# Patient Record
Sex: Male | Born: 1990 | Race: Black or African American | Hispanic: No | Marital: Married | Smoking: Never smoker
Health system: Southern US, Community
[De-identification: ages and names within clinical notes are randomized; demographics above are authoritative.]

## PROBLEM LIST (undated history)

## (undated) HISTORY — PX: WISDOM TOOTH EXTRACTION: SHX21

## (undated) HISTORY — PX: SHOULDER SURGERY: SHX246

---
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
## 2013-02-12 ENCOUNTER — Encounter (HOSPITAL_BASED_OUTPATIENT_CLINIC_OR_DEPARTMENT_OTHER): Payer: Self-pay | Admitting: Emergency Medicine

## 2013-02-12 ENCOUNTER — Emergency Department (HOSPITAL_BASED_OUTPATIENT_CLINIC_OR_DEPARTMENT_OTHER)
Admission: EM | Admit: 2013-02-12 | Discharge: 2013-02-13 | Disposition: A | Discharge status: Home / Self Care | Payer: Self-pay | Attending: Emergency Medicine | Admitting: Emergency Medicine

## 2013-02-12 DIAGNOSIS — M549 Dorsalgia, unspecified: Secondary | ICD-10-CM | POA: Insufficient documentation

## 2013-02-12 DIAGNOSIS — F17200 Nicotine dependence, unspecified, uncomplicated: Secondary | ICD-10-CM | POA: Insufficient documentation

## 2013-02-12 LAB — URINALYSIS, ROUTINE W REFLEX MICROSCOPIC
BILIRUBIN URINE: NEGATIVE
Glucose, UA: NEGATIVE mg/dL
Hgb urine dipstick: NEGATIVE
KETONES UR: NEGATIVE mg/dL
LEUKOCYTES UA: NEGATIVE
NITRITE: NEGATIVE
PROTEIN: NEGATIVE mg/dL
SPECIFIC GRAVITY, URINE: 1.023 (ref 1.005–1.030)
UROBILINOGEN UA: 1 mg/dL (ref 0.0–1.0)
pH: 7.5 (ref 5.0–8.0)

## 2013-02-12 MED ORDER — HYDROCOD POLST-CHLORPHEN POLST 10-8 MG/5ML PO LQCR
5.0000 mL | Freq: Once | ORAL | Status: AC
  Administered 2013-02-12: 5 mL via ORAL
  Filled 2013-02-12: qty 5

## 2013-02-12 MED ORDER — HYDROCOD POLST-CHLORPHEN POLST 10-8 MG/5ML PO LQCR: 5.0000 mL | Freq: Two times a day (BID) | ORAL | Status: DC | PRN

## 2013-02-12 MED ORDER — CYCLOBENZAPRINE HCL 10 MG PO TABS: 10.0000 mg | ORAL_TABLET | Freq: Three times a day (TID) | ORAL | Status: DC | PRN

## 2013-02-12 NOTE — Discharge Instructions (Signed)
Back Pain, Adult Low back pain is very common. About 1 in 5 people have back pain.The cause of low back pain is rarely dangerous. The pain often gets better over time.About half of people with a sudden onset of back pain feel better in just 2 weeks. About 8 in 10 people feel better by 6 weeks.  CAUSES Some common causes of back pain include:  Strain of the muscles or ligaments supporting the spine.  Wear and tear (degeneration) of the spinal discs.  Arthritis.  Direct injury to the back. DIAGNOSIS Most of the time, the direct cause of low back pain is not known.However, back pain can be treated effectively even when the exact cause of the pain is unknown.Answering your caregiver's questions about your overall health and symptoms is one of the most accurate ways to make sure the cause of your pain is not dangerous. If your caregiver needs more information, he or she may order lab work or imaging tests (X-rays or MRIs).However, even if imaging tests show changes in your back, this usually does not require surgery. HOME CARE INSTRUCTIONS For many people, back pain returns.Since low back pain is rarely dangerous, it is often a condition that people can learn to manageon their own.   Remain active. It is stressful on the back to sit or stand in one place. Do not sit, drive, or stand in one place for more than 30 minutes at a time. Take short walks on level surfaces as soon as pain allows.Try to increase the length of time you walk each day.  Do not stay in bed.Resting more than 1 or 2 days can delay your recovery.  Do not avoid exercise or work.Your body is made to move.It is not dangerous to be active, even though your back may hurt.Your back will likely heal faster if you return to being active before your pain is gone.  Pay attention to your body when you bend and lift. Many people have less discomfortwhen lifting if they bend their knees, keep the load close to their bodies,and  avoid twisting. Often, the most comfortable positions are those that put less stress on your recovering back.  Find a comfortable position to sleep. Use a firm mattress and lie on your side with your knees slightly bent. If you lie on your back, put a pillow under your knees.  Only take over-the-counter or prescription medicines as directed by your caregiver. Over-the-counter medicines to reduce pain and inflammation are often the most helpful.Your caregiver may prescribe muscle relaxant drugs.These medicines help dull your pain so you can more quickly return to your normal activities and healthy exercise.  Put ice on the injured area.  Put ice in a plastic bag.  Place a towel between your skin and the bag.  Leave the ice on for 15-20 minutes, 03-04 times a day for the first 2 to 3 days. After that, ice and heat may be alternated to reduce pain and spasms.  Ask your caregiver about trying back exercises and gentle massage. This may be of some benefit.  Avoid feeling anxious or stressed.Stress increases muscle tension and can worsen back pain.It is important to recognize when you are anxious or stressed and learn ways to manage it.Exercise is a great option. SEEK MEDICAL CARE IF:  You have pain that is not relieved with rest or medicine.  You have pain that does not improve in 1 week.  You have new symptoms.  You are generally not feeling well. SEEK   IMMEDIATE MEDICAL CARE IF:   You have pain that radiates from your back into your legs.  You develop new bowel or bladder control problems.  You have unusual weakness or numbness in your arms or legs.  You develop nausea or vomiting.  You develop abdominal pain.  You feel faint. Document Released: 01/17/2005 Document Revised: 07/19/2011 Document Reviewed: 06/07/2010 [employer] Patient Information 2014 [employer].  

## 2013-02-12 NOTE — ED Provider Notes (Signed)
CSN: [id number]     Arrival date & time 02/12/13  2242 History   None    This chart was scribed for Hanley Seamen, MD by Arlan Organ, ED Scribe. This patient was seen in room MH07/MH07 and the patient's care was started 11:43 PM.  Chief Complaint  Patient presents with  . Flank Pain   HPI  HPI Comments: Andrew Hampton is a 23 y.o. male who presents to the Emergency Department complaining of gradual onset, gradually worsening, moderate bilateral flank pain that initially started a few weeks ago, but has worsened in the last 24 hours. He also reports fatigue, a persistent productive cough, and a fever that has now resolved. Pain in flanks in worse with ambulation. He denies chills. Denies any known allergies. Denies heavy lifting or other abnormal activity.  History reviewed. No pertinent past medical history. History reviewed. No pertinent past surgical history. History reviewed. No pertinent family history. History  Substance Use Topics  . Smoking status: Current Every Day Smoker    Types: Cigarettes  . Smokeless tobacco: Not on file  . Alcohol Use: No    Review of Systems  All other systems reviewed and are negative.   Allergies  Review of patient's allergies indicates no known allergies.  Home Medications  No current outpatient prescriptions on file.  Triage Vitals: BP 164/96  Pulse 80  Temp(Src) 98.2 F (36.8 C) (Oral)  Resp 16  Physical Exam  General: Well-developed, well-nourished male in no acute distress; appearance consistent with age of record HENT: normocephalic; atraumatic Eyes: pupils equal, round and reactive to light; extraocular muscles intact Neck: supple Heart: regular rate and rhythm; no murmurs, rubs or gallops Lungs: clear to auscultation bilaterally Abdomen: soft; nondistended; nontender; no masses or hepatosplenomegaly; bowel sounds present Extremities: No deformity; full range of motion; pulses normal Back: Mild tenderness of flank muscles  bilaterally, worse on left than right; pain in flanks with flexion/extension of back Neurologic: Awake, alert and oriented; motor function intact in all extremities and symmetric; no facial droop Skin: Warm and dry Psychiatric: Normal mood and affect   ED Course  Procedures (including critical care time)    COORDINATION OF CARE: 11:47 PM- Will give pain medication and muscle relaxer. Discussed treatment plan with pt at bedside and pt agreed to plan.     MDM   Nursing notes and vitals signs, including pulse oximetry, reviewed.  Summary of this visit's results, reviewed by myself:  Labs:  Results for orders placed during the hospital encounter of 02/12/13 (from the past 24 hour(s))  URINALYSIS, ROUTINE W REFLEX MICROSCOPIC     Status: None   Collection Time    02/12/13 10:48 PM      Result Value Range   Color, Urine YELLOW  YELLOW   APPearance CLEAR  CLEAR   Specific Gravity, Urine 1.023  1.005 - 1.030   pH 7.5  5.0 - 8.0   Glucose, UA NEGATIVE  NEGATIVE mg/dL   Hgb urine dipstick NEGATIVE  NEGATIVE   Bilirubin Urine NEGATIVE  NEGATIVE   Ketones, ur NEGATIVE  NEGATIVE mg/dL   Protein, ur NEGATIVE  NEGATIVE mg/dL   Urobilinogen, UA 1.0  0.0 - 1.0 mg/dL   Nitrite NEGATIVE  NEGATIVE   Leukocytes, UA NEGATIVE  NEGATIVE     Dx bilateral flank muscle strain due to coughing (EPIC did not have adequate Dx)  I personally performed the services described in this documentation, which was scribed in my presence.  The recorded  information has been reviewed and is accurate.  Hanley SeamenJohn L Rishabh Rinkenberger, MD 02/13/13 0010

## 2013-02-12 NOTE — ED Notes (Signed)
Pt c/o bil flank pain x 3 days

## 2013-03-19 ENCOUNTER — Emergency Department (HOSPITAL_COMMUNITY)
Admission: EM | Admit: 2013-03-19 | Discharge: 2013-03-20 | Disposition: A | Discharge status: Home / Self Care | Payer: Managed Care, Other (non HMO) | Attending: Emergency Medicine | Admitting: Emergency Medicine

## 2013-03-19 ENCOUNTER — Encounter (HOSPITAL_COMMUNITY): Payer: Self-pay | Admitting: Emergency Medicine

## 2013-03-19 ENCOUNTER — Emergency Department (HOSPITAL_COMMUNITY)
Admission: EM | Admit: 2013-03-19 | Discharge: 2013-03-19 | Disposition: A | Discharge status: Home / Self Care | Payer: Managed Care, Other (non HMO) | Attending: Emergency Medicine | Admitting: Emergency Medicine

## 2013-03-19 DIAGNOSIS — R5381 Other malaise: Secondary | ICD-10-CM | POA: Insufficient documentation

## 2013-03-19 DIAGNOSIS — R109 Unspecified abdominal pain: Secondary | ICD-10-CM

## 2013-03-19 DIAGNOSIS — R112 Nausea with vomiting, unspecified: Secondary | ICD-10-CM

## 2013-03-19 DIAGNOSIS — F17200 Nicotine dependence, unspecified, uncomplicated: Secondary | ICD-10-CM | POA: Insufficient documentation

## 2013-03-19 DIAGNOSIS — R630 Anorexia: Secondary | ICD-10-CM | POA: Insufficient documentation

## 2013-03-19 DIAGNOSIS — R197 Diarrhea, unspecified: Secondary | ICD-10-CM

## 2013-03-19 DIAGNOSIS — R1031 Right lower quadrant pain: Secondary | ICD-10-CM | POA: Insufficient documentation

## 2013-03-19 DIAGNOSIS — R509 Fever, unspecified: Secondary | ICD-10-CM | POA: Insufficient documentation

## 2013-03-19 DIAGNOSIS — R5383 Other fatigue: Secondary | ICD-10-CM

## 2013-03-19 LAB — CBC WITH DIFFERENTIAL/PLATELET
BASOS ABS: 0 10*3/uL (ref 0.0–0.1)
BASOS ABS: 0 10*3/uL (ref 0.0–0.1)
BASOS PCT: 0 % (ref 0–1)
Basophils Relative: 0 % (ref 0–1)
Eosinophils Absolute: 0 10*3/uL (ref 0.0–0.7)
Eosinophils Absolute: 0 10*3/uL (ref 0.0–0.7)
Eosinophils Relative: 0 % (ref 0–5)
Eosinophils Relative: 0 % (ref 0–5)
HCT: 49.9 % (ref 39.0–52.0)
HCT: 41.6 % (ref 39.0–52.0)
HEMOGLOBIN: 18.4 g/dL — AB (ref 13.0–17.0)
HEMOGLOBIN: 15.3 g/dL (ref 13.0–17.0)
LYMPHS PCT: 12 % (ref 12–46)
Lymphocytes Relative: 4 % — ABNORMAL LOW (ref 12–46)
Lymphs Abs: 0.6 10*3/uL — ABNORMAL LOW (ref 0.7–4.0)
Lymphs Abs: 1 10*3/uL (ref 0.7–4.0)
MCH: 33 pg (ref 26.0–34.0)
MCH: 32.9 pg (ref 26.0–34.0)
MCHC: 36.9 g/dL — AB (ref 30.0–36.0)
MCHC: 36.8 g/dL — ABNORMAL HIGH (ref 30.0–36.0)
MCV: 89.4 fL (ref 78.0–100.0)
MCV: 89.5 fL (ref 78.0–100.0)
MONO ABS: 0.4 10*3/uL (ref 0.1–1.0)
MONOS PCT: 5 % (ref 3–12)
Monocytes Absolute: 0.7 10*3/uL (ref 0.1–1.0)
Monocytes Relative: 5 % (ref 3–12)
NEUTROS ABS: 14.4 10*3/uL — AB (ref 1.7–7.7)
NEUTROS ABS: 6.7 10*3/uL (ref 1.7–7.7)
NEUTROS PCT: 92 % — AB (ref 43–77)
Neutrophils Relative %: 83 % — ABNORMAL HIGH (ref 43–77)
PLATELETS: 156 10*3/uL (ref 150–400)
Platelets: 157 10*3/uL (ref 150–400)
RBC: 5.58 MIL/uL (ref 4.22–5.81)
RBC: 4.65 MIL/uL (ref 4.22–5.81)
RDW: 11.6 % (ref 11.5–15.5)
RDW: 11.7 % (ref 11.5–15.5)
WBC: 15.7 10*3/uL — ABNORMAL HIGH (ref 4.0–10.5)
WBC: 8.1 10*3/uL (ref 4.0–10.5)

## 2013-03-19 LAB — COMPREHENSIVE METABOLIC PANEL
ALT: 20 U/L (ref 0–53)
AST: 22 U/L (ref 0–37)
Albumin: 3.6 g/dL (ref 3.5–5.2)
Alkaline Phosphatase: 56 U/L (ref 39–117)
BILIRUBIN TOTAL: 0.5 mg/dL (ref 0.3–1.2)
BUN: 12 mg/dL (ref 6–23)
CHLORIDE: 100 meq/L (ref 96–112)
CO2: 25 meq/L (ref 19–32)
CREATININE: 1.11 mg/dL (ref 0.50–1.35)
Calcium: 8.7 mg/dL (ref 8.4–10.5)
GFR calc Af Amer: >90 mL/min (ref >90)
Glucose, Bld: 109 mg/dL — ABNORMAL HIGH (ref 70–99)
POTASSIUM: 3.7 meq/L (ref 3.7–5.3)
Sodium: 139 mEq/L (ref 137–147)
Total Protein: 7.3 g/dL (ref 6.0–8.3)

## 2013-03-19 LAB — POCT I-STAT, CHEM 8
BUN: 23 mg/dL (ref 6–23)
CHLORIDE: 102 meq/L (ref 96–112)
Calcium, Ion: 1.13 mmol/L (ref 1.12–1.23)
Creatinine, Ser: 1.2 mg/dL (ref 0.50–1.35)
Glucose, Bld: 133 mg/dL — ABNORMAL HIGH (ref 70–99)
HEMATOCRIT: 57 % — AB (ref 39.0–52.0)
Hemoglobin: 19.4 g/dL — ABNORMAL HIGH (ref 13.0–17.0)
POTASSIUM: 5.3 meq/L (ref 3.7–5.3)
SODIUM: 140 meq/L (ref 137–147)
TCO2: 27 mmol/L (ref 0–100)

## 2013-03-19 LAB — LIPASE, BLOOD: Lipase: 15 U/L (ref 11–59)

## 2013-03-19 MED ORDER — MORPHINE SULFATE 4 MG/ML IJ SOLN
4.0000 mg | Freq: Once | INTRAMUSCULAR | Status: AC
  Administered 2013-03-19: 4 mg via INTRAVENOUS
  Filled 2013-03-19: qty 1

## 2013-03-19 MED ORDER — ONDANSETRON HCL 4 MG/2ML IJ SOLN
4.0000 mg | Freq: Once | INTRAMUSCULAR | Status: AC
  Administered 2013-03-19: 4 mg via INTRAVENOUS
  Filled 2013-03-19: qty 2

## 2013-03-19 MED ORDER — SODIUM CHLORIDE 0.9 % IV BOLUS (SEPSIS)
1000.0000 mL | Freq: Once | INTRAVENOUS | Status: AC
  Administered 2013-03-19: 1000 mL via INTRAVENOUS

## 2013-03-19 MED ORDER — IOHEXOL 300 MG/ML  SOLN
25.0000 mL | INTRAMUSCULAR | Status: AC
  Administered 2013-03-19: 25 mL via ORAL

## 2013-03-19 MED ORDER — PROMETHAZINE HCL 12.5 MG PO TABS: 12.5000 mg | ORAL_TABLET | Freq: Four times a day (QID) | ORAL | Status: DC | PRN

## 2013-03-19 NOTE — ED Notes (Signed)
Pt states that he was seen yesterday for the emesis, abdominal pain and diarrhea. Pt states that he was given medications here and his fever went down here. Pt got his prescriptions filled for nausea and took the medication but was unable to eat or drink. Pt states unable to keep water or Gatorade down.

## 2013-03-19 NOTE — ED Provider Notes (Signed)
CSN: [id number]     Arrival date & time 03/19/13  2023 History   First MD Initiated Contact with Patient 03/19/13 2106     Chief Complaint  Patient presents with  . Emesis  . Abdominal Pain     (Consider location/radiation/quality/duration/timing/severity/associated sxs/prior Treatment) HPI Comments: Patient is a 23 year old male who presents today with nausea, vomiting, abdominal pain. The pain began last night and has been gradually worsening. He was evaluated in the emergency Department at 9:00 this morning. He was found to have an elevated white blood cell count, but for workup was otherwise unremarkable. He was told by Roxy Horsemanobert Browning, PA-C To return if he developed a fever or could not tolerate any liquids. He has been unable to take his nausea medicine and due to vomiting. He was cooking hot pockets earlier in the smell worsened his nausea. His pain has localized to his right lower quadrant. He denies any abdominal surgeries. His son has recently had similar symptoms.   Patient is a 23 y.o. male presenting with vomiting and abdominal pain. The history is provided by the patient. No language interpreter was used.  Emesis Associated symptoms: abdominal pain   Abdominal Pain Associated symptoms: fatigue, fever, nausea and vomiting   Associated symptoms: no chest pain and no shortness of breath     History reviewed. No pertinent past medical history. Past Surgical History  Procedure Laterality Date  . Wisdom tooth extraction    . Shoulder surgery      librium   History reviewed. No pertinent family history. History  Substance Use Topics  . Smoking status: Current Every Day Smoker    Types: Cigarettes  . Smokeless tobacco: Not on file  . Alcohol Use: No    Review of Systems  Constitutional: Positive for fever, appetite change and fatigue.  Respiratory: Negative for shortness of breath.   Cardiovascular: Negative for chest pain.  Gastrointestinal: Positive for nausea,  vomiting and abdominal pain. Negative for blood in stool and anal bleeding.  All other systems reviewed and are negative.      Allergies  Bee venom and Shellfish allergy  Home Medications   Current Outpatient Rx  Name  Route  Sig  Dispense  Refill  . promethazine (PHENERGAN) 12.5 MG tablet   Oral   Take 1 tablet (12.5 mg total) by mouth every 6 (six) hours as needed for nausea or vomiting.   15 tablet   0    BP 146/85  Pulse 103  Temp(Src) 100.4 F (38 C) (Oral)  Resp 16  Ht 6' (1.829 m)  Wt 156 lb (70.761 kg)  BMI 21.15 kg/m2  SpO2 100% Physical Exam  Nursing note and vitals reviewed. Constitutional: He is oriented to person, place, and time. He appears well-developed and well-nourished. No distress.  HENT:  Head: Normocephalic and atraumatic.  Right Ear: External ear normal.  Left Ear: External ear normal.  Nose: Nose normal.  Eyes: Conjunctivae are normal.  Neck: Normal range of motion. No tracheal deviation present.  Cardiovascular: Normal rate, regular rhythm and normal heart sounds.   Pulmonary/Chest: Effort normal and breath sounds normal. No stridor.  Abdominal: Soft. Bowel sounds are normal. He exhibits no distension. There is tenderness in the right lower quadrant. There is no rigidity and no guarding.  Musculoskeletal: Normal range of motion.  Neurological: He is alert and oriented to person, place, and time.  Skin: Skin is warm and dry. He is not diaphoretic.  Psychiatric: He has a normal mood and  affect. His behavior is normal.    ED Course  Procedures (including critical care time) Labs Review Labs Reviewed  CBC WITH DIFFERENTIAL - Abnormal; Notable for the following:    MCHC 36.8 (*)    Neutrophils Relative % 83 (*)    All other components within normal limits  COMPREHENSIVE METABOLIC PANEL - Abnormal; Notable for the following:    Glucose, Bld 109 (*)    All other components within normal limits  LIPASE, BLOOD   Imaging Review Ct Abdomen  Pelvis W Contrast  03/20/2013   CLINICAL DATA:  Nausea and vomiting.  EXAM: CT ABDOMEN AND PELVIS WITH CONTRAST  TECHNIQUE: Multidetector CT imaging of the abdomen and pelvis was performed using the standard protocol following bolus administration of intravenous contrast.  CONTRAST:  OMNIPAQUE IOHEXOL 300 MG/ML  SOLN  COMPARISON:  None available for comparison at time of study interpretation.  FINDINGS: Included view of the lung bases are clear. Visualized heart and pericardium are unremarkable.  The liver, spleen, gallbladder, pancreas and adrenal glands are unremarkable.  The stomach, small and large bowel are normal in course and caliber without inflammatory changes. Contrast has not yet reached the large bowel. 3 mm appendicolith, without superimposed appendiceal inflammatory changes or dilatation. No intraperitoneal free fluid nor free air.  Kidneys are orthotopic, demonstrating symmetric enhancement without nephrolithiasis, hydronephrosis or renal masses. The unopacified ureters are normal in course and caliber. Delayed imaging through the kidneys demonstrates symmetric prompt excretion to the proximal urinary collecting system. Urinary bladder is partially distended and unremarkable.  Great vessels are normal in course and caliber. No lymphadenopathy by CT size criteria. Internal reproductive organs are unremarkable. The soft tissues and included osseous structures are nonsuspicious. Scattered Schmorl's nodes.  IMPRESSION: No acute intra-abdominal or pelvic process. Appendicolith without appendicitis.   Electronically Signed   By: Awilda Metro   On: 03/20/2013 01:05    EKG Interpretation   None       MDM   Final diagnoses:  Abdominal pain  Nausea vomiting and diarrhea   Patient is nontoxic, nonseptic appearing, in no apparent distress.  Patient's pain and other symptoms adequately managed in emergency department.  Fluid bolus given.  Labs, imaging and vitals reviewed.  Patient does  not meet the SIRS or Sepsis criteria.  On repeat exam patient does not have a surgical abdomen and there are no peritoneal signs.  No indication of appendicitis, bowel obstruction, bowel perforation, cholecystitis, diverticulitis. Patient discharged home with symptomatic treatment and given strict instructions for follow-up with their primary care physician.  I have also discussed reasons to return immediately to the ER.  Patient expresses understanding and agrees with plan.       Mora Bellman, PA-C 03/20/13 1146

## 2013-03-19 NOTE — ED Provider Notes (Signed)
CSN: [id number]     Arrival date & time 03/19/13  7829 History   First MD Initiated Contact with Patient 03/19/13 [id number]     Chief Complaint  Patient presents with  . Nausea  . Emesis  . Diarrhea     (Consider location/radiation/quality/duration/timing/severity/associated sxs/prior Treatment) HPI Comments: Patient presents to the ED with a chief complaint of n/v/d and abdominal cramps.  Patient states that the symptoms began last night.  He states that his son was recently sick with similar symptoms.  He states that his level of discomfort is an 8/10.  He has not tried taking anything to alleviate his symptoms.  He denies fevers or chills.   Denies melena or hematemesis.  No past abdominal surgeries.  The history is provided by the patient. No language interpreter was used.    History reviewed. No pertinent past medical history. History reviewed. No pertinent past surgical history. No family history on file. History  Substance Use Topics  . Smoking status: Current Every Day Smoker    Types: Cigarettes  . Smokeless tobacco: Not on file  . Alcohol Use: No    Review of Systems  All other systems reviewed and are negative.      Allergies  Review of patient's allergies indicates no known allergies.  Home Medications   Current Outpatient Rx  Name  Route  Sig  Dispense  Refill  . chlorpheniramine-HYDROcodone (TUSSIONEX PENNKINETIC ER) 10-8 MG/5ML LQCR   Oral   Take 5 mLs by mouth every 12 (twelve) hours as needed for cough.   70 mL   0   . cyclobenzaprine (FLEXERIL) 10 MG tablet   Oral   Take 1 tablet (10 mg total) by mouth 3 (three) times daily as needed for muscle spasms.   20 tablet   0    BP 157/84  Pulse 77  Temp(Src) 97.9 F (36.6 C) (Oral)  Resp 12  Ht 6' (1.829 m)  Wt 145 lb (65.772 kg)  BMI 19.66 kg/m2  SpO2 100% Physical Exam  Nursing note and vitals reviewed. Constitutional: He is oriented to person, place, and time. He appears well-developed and  well-nourished.  HENT:  Head: Normocephalic and atraumatic.  Eyes: Conjunctivae and EOM are normal. Pupils are equal, round, and reactive to light. Right eye exhibits no discharge. Left eye exhibits no discharge. No scleral icterus.  Neck: Normal range of motion. Neck supple. No JVD present.  Cardiovascular: Normal rate, regular rhythm and normal heart sounds.  Exam reveals no gallop and no friction rub.   No murmur heard. Pulmonary/Chest: Effort normal and breath sounds normal. No respiratory distress. He has no wheezes. He has no rales. He exhibits no tenderness.  Abdominal: Soft. He exhibits no distension and no mass. There is no tenderness. There is no rebound and no guarding.  No focal abdominal tenderness, no RLQ tenderness or pain at McBurney's point, no RUQ tenderness or Murphy's sign, no left-sided abdominal tenderness, no fluid wave, or signs of peritonitis   Musculoskeletal: Normal range of motion. He exhibits no edema and no tenderness.  Neurological: He is alert and oriented to person, place, and time.  Skin: Skin is warm and dry.  Psychiatric: He has a normal mood and affect. His behavior is normal. Judgment and thought content normal.    ED Course  Procedures (including critical care time) Labs Review Results for orders placed during the hospital encounter of 03/19/13  CBC WITH DIFFERENTIAL      Result Value Ref Range  WBC 15.7 (*) 4.0 - 10.5 K/uL   RBC 5.58  4.22 - 5.81 MIL/uL   Hemoglobin 18.4 (*) 13.0 - 17.0 g/dL   HCT 04.549.9  40.939.0 - 81.152.0 %   MCV 89.4  78.0 - 100.0 fL   MCH 33.0  26.0 - 34.0 pg   MCHC 36.9 (*) 30.0 - 36.0 g/dL   RDW 91.411.6  78.211.5 - 95.615.5 %   Platelets 156  150 - 400 K/uL   Neutrophils Relative % 92 (*) 43 - 77 %   Neutro Abs 14.4 (*) 1.7 - 7.7 K/uL   Lymphocytes Relative 4 (*) 12 - 46 %   Lymphs Abs 0.6 (*) 0.7 - 4.0 K/uL   Monocytes Relative 5  3 - 12 %   Monocytes Absolute 0.7  0.1 - 1.0 K/uL   Eosinophils Relative 0  0 - 5 %   Eosinophils  Absolute 0.0  0.0 - 0.7 K/uL   Basophils Relative 0  0 - 1 %   Basophils Absolute 0.0  0.0 - 0.1 K/uL  POCT I-STAT, CHEM 8      Result Value Ref Range   Sodium 140  137 - 147 mEq/L   Potassium 5.3  3.7 - 5.3 mEq/L   Chloride 102  96 - 112 mEq/L   BUN 23  6 - 23 mg/dL   Creatinine, Ser 2.131.20  0.50 - 1.35 mg/dL   Glucose, Bld 086133 (*) 70 - 99 mg/dL   Calcium, Ion 5.781.13  4.691.12 - 1.23 mmol/L   TCO2 27  0 - 100 mmol/L   Hemoglobin 19.4 (*) 13.0 - 17.0 g/dL   HCT 62.957.0 (*) 52.839.0 - 41.352.0 %   No results found.    EKG Interpretation   None       MDM   Final diagnoses:  Nausea vomiting and diarrhea    Pt with n/v/d and abdominal cramping.  No focal tenderness.  Will check labs, give fluids, treat pain and nausea.  Suspect viral syndrome.  Child was sick with the same 3 days ago.  No indication for imaging at this time.  Will reassess.  Leukocytosis, and some dehydration.  I suspect that both are related to his symptoms of vomiting and diarrhea, and are not specific for other acute abdominal process.   10:37 AM Patient is feeling better.  Tolerating PO.  Repeat abdominal exam is benign, no focal tenderness or signs of surgical abdomen.  Will give on more fluid bolus then discharge to home.  Will discharge with zofran.  I discussed specific return precautions with the patient.  He understands and agrees with the plan.      Roxy Horsemanobert Nicolaas Savo, PA-C 03/19/13 [phone]

## 2013-03-19 NOTE — ED Notes (Signed)
Pt provided sprite. Apple juice for patient's son.

## 2013-03-19 NOTE — ED Notes (Signed)
Pt states that he took the Zofran prescribed to him, but didn't help his nausea.

## 2013-03-19 NOTE — ED Notes (Signed)
Pt reports n/v/d since last night.

## 2013-03-19 NOTE — ED Notes (Signed)
Family at bedside with pt's son. Cartoons turned on for patient's son. Pt provided ice chips in small amount for dry mouth. Will continue to monitor.

## 2013-03-19 NOTE — ED Provider Notes (Signed)
Medical screening examination/treatment/procedure(s) were performed by non-physician practitioner and as supervising physician I was immediately available for consultation/collaboration.  EKG Interpretation   None        Juliet RudeNathan R. Rubin PayorPickering, MD 03/19/13 [phone]

## 2013-03-19 NOTE — ED Notes (Signed)
Pt is tolerating ice chips but reports sprite makes him feel nauseated. PA made aware. Pt still to be discharged.

## 2013-03-19 NOTE — Discharge Instructions (Signed)
Diarrhea °Diarrhea is frequent loose and watery bowel movements. It can cause you to feel weak and dehydrated. Dehydration can cause you to become tired and thirsty, have a dry mouth, and have decreased urination that often is dark yellow. Diarrhea is a sign of another problem, most often an infection that will not last long. In most cases, diarrhea typically lasts 2 3 days. However, it can last longer if it is a sign of something more serious. It is important to treat your diarrhea as directed by your caregive to lessen or prevent future episodes of diarrhea. °CAUSES  °Some common causes include: °· Gastrointestinal infections caused by viruses, bacteria, or parasites. °· Food poisoning or food allergies. °· Certain medicines, such as antibiotics, chemotherapy, and laxatives. °· Artificial sweeteners and fructose. °· Digestive disorders. °HOME CARE INSTRUCTIONS °· Ensure adequate fluid intake (hydration): have 1 cup (8 oz) of fluid for each diarrhea episode. Avoid fluids that contain simple sugars or sports drinks, fruit juices, whole milk products, and sodas. Your urine should be clear or pale yellow if you are drinking enough fluids. Hydrate with an oral rehydration solution that you can purchase at pharmacies, retail stores, and online. You can prepare an oral rehydration solution at home by mixing the following ingredients together: °·   tsp table salt. °· ¾ tsp baking soda. °·  tsp salt substitute containing potassium chloride. °· 1  tablespoons sugar. °· 1 L (34 oz) of water. °· Certain foods and beverages may increase the speed at which food moves through the gastrointestinal (GI) tract. These foods and beverages should be avoided and include: °· Caffeinated and alcoholic beverages. °· High-fiber foods, such as raw fruits and vegetables, nuts, seeds, and whole grain breads and cereals. °· Foods and beverages sweetened with sugar alcohols, such as xylitol, sorbitol, and mannitol. °· Some foods may be well  tolerated and may help thicken stool including: °· Starchy foods, such as rice, toast, pasta, low-sugar cereal, oatmeal, grits, baked potatoes, crackers, and bagels. °· Bananas. °· Applesauce. °· Add probiotic-rich foods to help increase healthy bacteria in the GI tract, such as yogurt and fermented milk products. °· Wash your hands well after each diarrhea episode. °· Only take over-the-counter or prescription medicines as directed by your caregiver. °· Take a warm bath to relieve any burning or pain from frequent diarrhea episodes. °SEEK IMMEDIATE MEDICAL CARE IF:  °· You are unable to keep fluids down. °· You have persistent vomiting. °· You have blood in your stool, or your stools are black and tarry. °· You do not urinate in 6 8 hours, or there is only a small amount of very dark urine. °· You have abdominal pain that increases or localizes. °· You have weakness, dizziness, confusion, or lightheadedness. °· You have a severe headache. °· Your diarrhea gets worse or does not get better. °· You have a fever or persistent symptoms for more than 2 3 days. °· You have a fever and your symptoms suddenly get worse. °MAKE SURE YOU:  °· Understand these instructions. °· Will watch your condition. °· Will get help right away if you are not doing well or get worse. °Document Released: 01/07/2002 Document Revised: 01/04/2012 Document Reviewed: 09/25/2011 °[employer]® Patient Information ©2014 [employer]. ° °Nausea and Vomiting °Nausea is a sick feeling that often comes before throwing up (vomiting). Vomiting is a reflex where stomach contents come out of your mouth. Vomiting can cause severe loss of body fluids (dehydration). Children and elderly adults can become   dehydrated quickly, especially if they also have diarrhea. Nausea and vomiting are symptoms of a condition or disease. It is important to find the cause of your symptoms. °CAUSES  °· Direct irritation of the stomach lining. This irritation can result from  increased acid production (gastroesophageal reflux disease), infection, food poisoning, taking certain medicines (such as nonsteroidal anti-inflammatory drugs), alcohol use, or tobacco use. °· Signals from the brain. These signals could be caused by a headache, heat exposure, an inner ear disturbance, increased pressure in the brain from injury, infection, a tumor, or a concussion, pain, emotional stimulus, or metabolic problems. °· An obstruction in the gastrointestinal tract (bowel obstruction). °· Illnesses such as diabetes, hepatitis, gallbladder problems, appendicitis, kidney problems, cancer, sepsis, atypical symptoms of a heart attack, or eating disorders. °· Medical treatments such as chemotherapy and radiation. °· Receiving medicine that makes you sleep (general anesthetic) during surgery. °DIAGNOSIS °Your caregiver may ask for tests to be done if the problems do not improve after a few days. Tests may also be done if symptoms are severe or if the reason for the nausea and vomiting is not clear. Tests may include: °· Urine tests. °· Blood tests. °· Stool tests. °· Cultures (to look for evidence of infection). °· X-rays or other imaging studies. °Test results can help your caregiver make decisions about treatment or the need for additional tests. °TREATMENT °You need to stay well hydrated. Drink frequently but in small amounts. You may wish to drink water, sports drinks, clear broth, or eat frozen ice pops or gelatin dessert to help stay hydrated. When you eat, eating slowly may help prevent nausea. There are also some antinausea medicines that may help prevent nausea. °HOME CARE INSTRUCTIONS  °· Take all medicine as directed by your caregiver. °· If you do not have an appetite, do not force yourself to eat. However, you must continue to drink fluids. °· If you have an appetite, eat a normal diet unless your caregiver tells you differently. °· Eat a variety of complex carbohydrates (rice, wheat, potatoes,  bread), lean meats, yogurt, fruits, and vegetables. °· Avoid high-fat foods because they are more difficult to digest. °· Drink enough water and fluids to keep your urine clear or pale yellow. °· If you are dehydrated, ask your caregiver for specific rehydration instructions. Signs of dehydration may include: °· Severe thirst. °· Dry lips and mouth. °· Dizziness. °· Dark urine. °· Decreasing urine frequency and amount. °· Confusion. °· Rapid breathing or pulse. °SEEK IMMEDIATE MEDICAL CARE IF:  °· You have blood or brown flecks (like coffee grounds) in your vomit. °· You have black or bloody stools. °· You have a severe headache or stiff neck. °· You are confused. °· You have severe abdominal pain. °· You have chest pain or trouble breathing. °· You do not urinate at least once every 8 hours. °· You develop cold or clammy skin. °· You continue to vomit for longer than 24 to 48 hours. °· You have a fever. °MAKE SURE YOU:  °· Understand these instructions. °· Will watch your condition. °· Will get help right away if you are not doing well or get worse. °Document Released: 01/17/2005 Document Revised: 04/11/2011 Document Reviewed: 06/16/2010 °[employer]® Patient Information ©2014 [employer]. ° °

## 2013-03-20 ENCOUNTER — Emergency Department (HOSPITAL_COMMUNITY): Payer: Managed Care, Other (non HMO)

## 2013-03-20 ENCOUNTER — Encounter (HOSPITAL_COMMUNITY): Payer: Self-pay | Admitting: Radiology

## 2013-03-20 MED ORDER — IOHEXOL 300 MG/ML  SOLN
100.0000 mL | Freq: Once | INTRAMUSCULAR | Status: AC | PRN
  Administered 2013-03-20: 100 mL via INTRAVENOUS

## 2013-03-20 MED ORDER — GI COCKTAIL ~~LOC~~
30.0000 mL | Freq: Once | ORAL | Status: AC
  Administered 2013-03-20: 30 mL via ORAL
  Filled 2013-03-20: qty 30

## 2013-03-20 MED ORDER — ONDANSETRON HCL 4 MG/2ML IJ SOLN
4.0000 mg | Freq: Once | INTRAMUSCULAR | Status: AC
  Administered 2013-03-20: 4 mg via INTRAVENOUS
  Filled 2013-03-20: qty 2

## 2013-03-20 NOTE — Discharge Instructions (Signed)
Abdominal Pain, Adult Many things can cause belly (abdominal) pain. Most times, the belly pain is not dangerous. Many cases of belly pain can be watched and treated at home. HOME CARE   Do not take medicines that help you go poop (laxatives) unless told to by your doctor.  Only take medicine as told by your doctor.  Eat or drink as told by your doctor. Your doctor will tell you if you should be on a special diet. GET HELP IF:  You do not know what is causing your belly pain.  You have belly pain while you are sick to your stomach (nauseous) or have runny poop (diarrhea).  You have pain while you pee or poop.  Your belly pain wakes you up at night.  You have belly pain that gets worse or better when you eat.  You have belly pain that gets worse when you eat fatty foods. GET HELP RIGHT AWAY IF:   The pain does not go away within 2 hours.  You have a fever.  You keep throwing up (vomiting).  The pain changes and is only in the right or left part of the belly.  You have bloody or tarry looking poop. MAKE SURE YOU:   Understand these instructions.  Will watch your condition.  Will get help right away if you are not doing well or get worse. Document Released: 07/06/2007 Document Revised: 11/07/2012 Document Reviewed: 09/26/2012 [facility] Patient Information 2014 [place], [place].  Viral Infections A virus is a type of germ. Viruses can cause:  Minor sore throats.  Aches and pains.  Headaches.  Runny nose.  Rashes.  Watery eyes.  Tiredness.  Coughs.  Loss of appetite.  Feeling sick to your stomach (nausea).  Throwing up (vomiting).  Watery poop (diarrhea). HOME CARE   Only take medicines as told by your doctor.  Drink enough water and fluids to keep your pee (urine) clear or pale yellow. Sports drinks are a good choice.  Get plenty of rest and eat healthy. Soups and broths with crackers or rice are fine. GET HELP RIGHT AWAY IF:   You have a very  bad headache.  You have shortness of breath.  You have chest pain or neck pain.  You have an unusual rash.  You cannot stop throwing up.  You have watery poop that does not stop.  You cannot keep fluids down.  You or your child has a temperature by mouth above 102 F (38.9 C), not controlled by medicine.  Your baby is older than 3 months with a rectal temperature of 102 F (38.9 C) or higher.  Your baby is 39 months old or younger with a rectal temperature of 100.4 F (38 C) or higher. MAKE SURE YOU:   Understand these instructions.  Will watch this condition.  Will get help right away if you are not doing well or get worse. Document Released: 12/31/2007 Document Revised: 04/11/2011 Document Reviewed: 05/25/2010 [facility] Patient Information 2014 [place], [place].   Emergency Department Resource Guide 1) Find a Doctor and Pay Out of Pocket Although you won't have to find out who is covered by your insurance plan, it is a good idea to ask around and get recommendations. You will then need to call the office and see if the doctor you have chosen will accept you as a new patient and what types of options they offer for patients who are self-pay. Some doctors offer discounts or will set up payment plans for their patients who  do not have insurance, but you will need to ask so you aren't surprised when you get to your appointment.  2) Contact Your Local Health Department Not all health departments have doctors that can see patients for sick visits, but many do, so it is worth a call to see if yours does. If you don't know where your local health department is, you can check in your phone book. The CDC also has a tool to help you locate your state's health department, and many state websites also have listings of all of their local health departments.  3) Find a Walk-in Clinic If your illness is not likely to be very severe or complicated, you may want to try a walk in clinic. These  are popping up all over the country in pharmacies, drugstores, and shopping centers. They're usually staffed by nurse practitioners or physician assistants that have been trained to treat common illnesses and complaints. They're usually fairly quick and inexpensive. However, if you have serious medical issues or chronic medical problems, these are probably not your best option.  No Primary Care Doctor: - Call Health Connect at  [phone] - they can help you locate a primary care doctor that  accepts your insurance, provides certain services, etc. - Physician Referral Service- [phone]  Chronic Pain Problems: Organization         Address  Phone   Notes  [facility]  [phone] Patients need to be referred by their primary care doctor.   Medication Assistance: Organization         Address  Phone   Notes  [facility] Medication [facility] [address] [place], [place] [address] [phone] --Must be a resident of [facility] -- Must have NO insurance coverage whatsoever (no Medicaid/ Medicare, etc.) -- The pt. MUST have a primary care doctor that directs their care regularly and follows them in the community   [facility]  [phone]   [employer]  [phone]    Agencies that provide inexpensive medical care: Organization         Address  Phone   Notes  [facility]  [phone]   [facility]    [phone]   [facility] [address] [place], [place] [address] [phone]   [facility] [address] [place], [place] [phone]   Planned Parenthood    [phone]   [facility]    [phone]   [facility]  [address], [place] Phone:  [phone], Fax:  [phone] Hours of Operation:  9 am - 6 pm, M-F.  Also accepts Medicaid/Medicare and self-pay.  [facility]   [address], [place] Phone: [phone], Fax: [phone]. Hours of Operation:  8:30 am - 5:30 pm, M-F.  Also accepts Medicaid and self-pay.  [facility] [place] [address] [place] Phone: [phone]   [facility] [address] Natasha Bence [place], [place] [phone], Ext. [phone] Mondays & Thursdays: 7-9 AM.  First 15 patients are seen on a first come, first serve basis.    Medicaid-accepting [facility] Providers:  Organization         Address  Phone   Notes  [facility] [address],  [phone] Also accepts self-pay patients.  [facility]  [address] Laurell Josephs [place], [place]  [phone]   [facility] [address], [place] [phone]   [facility] [address] [place] [phone]   Renaye Rakers [address], [place]   [phone] Only accepts [place] patients after they have their name applied to their card.   Self-Pay (no insurance) in [facility]:  Organization         Address  Phone   Notes  Sickle Cell Patients, [facility] [address] [place], [place] [phone]   [facility] [address] [place], [place] [phone]   [facility] [place]  [address], [place] [phone]   [facility]/Dr. Osei-Bonsu  [address], [place] or [address], [place] [phone] Phone number for both [place] and [place] locations is the same.  [facility] [address], [place] [phone]   [facility] [address] [place] or [address] [phone]   [facility] [address] [place] [phone], phone; [phone], fax Sees patients 1st and 3rd  Saturday of every month.  Must not qualify for public or private insurance (i.e. Medicaid, Medicare, [place] Health Choice, Veterans' Benefits)  Household income should be no more than 200% of the poverty level The clinic cannot treat you if you are pregnant or think you are pregnant  Sexually transmitted diseases are not treated at the clinic.    Dental Care: Organization         Address  Phone  Notes  [facility] Department of [facility] [address] [place], [place] [phone] Accepts children up to age 30 who are enrolled in [place] or [place] Health Choice; pregnant women with a Medicaid card; and children who have applied for Medicaid or [place] Health Choice, but were declined, whose parents can pay a reduced fee at time of service.  [facility] of [facility]  [address], [place] [phone] Accepts children up to age 94 who are enrolled in [place] or [place] Health Choice; pregnant women with a Medicaid card; and children who have applied for Medicaid or  Health Choice, but were declined, whose parents can pay a reduced fee at time of service.  [facility]  [address] [place], [place] [phone] Patients are seen by appointment only. Walk-ins are not accepted. [facility] will see patients 17 years of age and older. Monday - Tuesday (8am-5pm) Most Wednesdays (8:30-5pm) $30 per visit, cash only  [facility]  [address], [facility] [phone] Patients are seen by appointment only. Walk-ins are not accepted. [facility] will see patients 70 years of age and older. One Wednesday Evening (Monthly: Volunteer Based).  $30 per visit, cash only  [employer]  [phone] for adults; Children under age 35, call Graduate Pediatric Dentistry at [phone]. Children aged 60-14, please call [phone] to request a pediatric  application.  Dental services are provided in all areas of dental care including fillings, crowns and bridges, complete and partial dentures, implants, gum treatment, root canals, and extractions. Preventive care is also provided. Treatment is provided to both adults and children. Patients  are selected via a lottery and there is often a waiting list.   [facility] [address], [place]  [phone] [email or web address]   [facility] [address], [place], [place] [phone], Ext. [phone] Second and Fourth Thursday of each month, opens at 6:30 AM; Clinic ends at 9 AM.  Patients are seen on a first-come first-served basis, and a limited number are seen during each clinic.   [facility]  [address], [place], [place] [phone]   Eligibility Requirements You must have lived in [place], [place], or [place] counties for at least the last three months.   You cannot be eligible for state or federal sponsored [place]healthcare insurance, including [employer]IG[employer]eterans Administration, [place]icaid, or [employer]icare.   You generally cannot be eligible for healthcare insurance through your employer.    How to apply: Eligibility screenings are held every Tuesday and Wednesday afternoon from 1:00 pm until 4:00 pm. You do not need an appointment for the interview!  [facility] [address], [place], [place] [phone]   [facility]  [phone]   [facility] [place] [facility]  [phone]   [facility]  [phone]    Behavioral Health Resources in the Community: Intensive Outpatient Programs Organization         Address  Phone  Notes  [facility] [address], [place], [place] [phone]   [facility] [address], [place], [place] [phone]   [facility] [address], [place], [place]  [phone]   [facility]  [address],  [place], [place] [phone] or [phone]   Substance Abuse Resources Organization         Address  Phone  Notes  [facility]  [phone]   [facility]  [phone]   [facility]  [phone]   [facility]  [phone]   Residential & Outpatient Substance Abuse Program  [phone]   Psychological Services Organization         Address  Phone  Notes  [facility]  [phone]   [facility]  [phone]- [phone]   [facility] [address], [place] [phone] or [phone]    Mobile Crisis Teams Organization         Address  Phone  Notes  [facility], Mobile Crisis Care Unit  [phone]   [facility]  [address] [place], [place] [phone]   Doristine LocksSharon DeEsch [address] [place] [phone]    Self-Help/Support Groups Organization         Address  Phone             Notes  [facility] [place] - variety of support groups  [phone] Call for more information  Narcotics Anonymous (NA), [facility] [address], [place]  2 meetings at this location   Statisticianesidential Treatment Programs Organization         Address  Phone  Notes  [facility] [address],    [place]  [phone]   [facility]  [address], [place] [phone], [place], [place] [phone]   [facility] [address], [place] [phone] Admissions: 8am-3pm M-F  [facility] [address]    [place], [place] [phone]   [facility] [address] Leonard Schwartz [place], [place] [phone]   [facility] [address]  [place], [place] [phone]   [facility] - Intensive Outpatient [address] Laurell Joseph[address], [place], [place] [phone]   [facility] ([facility] [address] [place]    [place], [place] [phone] or [phone]   [facility] ([facility]) [address], [place], [place] [phone] Accepts Medicaid  [place] [address],  [place] [phone] Substance Abuse/Addiction Treatment   [facility] Organization         Address  Phone  Notes  [facility]  [phone]   Angie Fava, PhD [address] Ervin Knack [place], [place]   [phone] or [phone]   [facility]   [address] [place], [place] [phone]   [facility] [address] [place], [place] [phone] Insurance/Medicaid/sponsorship through [facility] [address]                                    [place], [place] [phone] Therapy/tele-psych/case  [facility] [address] [place], [place] [phone]    Dr. Lolly Mustache  [phone]   [facility]  [employer] [facility] 1) [address], [place] 2) [address] [place] 3)  [address], [place] [phone]  [phone]   [facility] Child Abuse Hotline [phone] or [phone] (After Hours)

## 2013-03-20 NOTE — ED Notes (Signed)
CT notified that the pt has finished his contrast.  

## 2013-03-21 NOTE — ED Provider Notes (Signed)
Medical screening examination/treatment/procedure(s) were performed by non-physician practitioner and as supervising physician I was immediately available for consultation/collaboration.  EKG Interpretation   None        Munirah Doerner L Ellan Tess, MD 03/21/13 0009 

## 2013-12-12 ENCOUNTER — Encounter (HOSPITAL_COMMUNITY): Payer: Self-pay | Admitting: Emergency Medicine

## 2013-12-12 ENCOUNTER — Emergency Department (HOSPITAL_COMMUNITY)
Admission: EM | Admit: 2013-12-12 | Discharge: 2013-12-12 | Disposition: A | Discharge status: Home / Self Care | Payer: Managed Care, Other (non HMO) | Attending: Emergency Medicine | Admitting: Emergency Medicine

## 2013-12-12 ENCOUNTER — Emergency Department (HOSPITAL_COMMUNITY): Payer: Managed Care, Other (non HMO)

## 2013-12-12 DIAGNOSIS — T148XXA Other injury of unspecified body region, initial encounter: Secondary | ICD-10-CM

## 2013-12-12 DIAGNOSIS — R059 Cough, unspecified: Secondary | ICD-10-CM

## 2013-12-12 DIAGNOSIS — Z720 Tobacco use: Secondary | ICD-10-CM | POA: Diagnosis not present

## 2013-12-12 DIAGNOSIS — Y929 Unspecified place or not applicable: Secondary | ICD-10-CM | POA: Insufficient documentation

## 2013-12-12 DIAGNOSIS — X58XXXA Exposure to other specified factors, initial encounter: Secondary | ICD-10-CM | POA: Insufficient documentation

## 2013-12-12 DIAGNOSIS — R05 Cough: Secondary | ICD-10-CM | POA: Insufficient documentation

## 2013-12-12 DIAGNOSIS — Y999 Unspecified external cause status: Secondary | ICD-10-CM | POA: Insufficient documentation

## 2013-12-12 DIAGNOSIS — Y939 Activity, unspecified: Secondary | ICD-10-CM | POA: Diagnosis not present

## 2013-12-12 DIAGNOSIS — S39012A Strain of muscle, fascia and tendon of lower back, initial encounter: Secondary | ICD-10-CM | POA: Insufficient documentation

## 2013-12-12 DIAGNOSIS — M545 Low back pain: Secondary | ICD-10-CM | POA: Diagnosis present

## 2013-12-12 LAB — URINALYSIS, ROUTINE W REFLEX MICROSCOPIC
Bilirubin Urine: NEGATIVE
Glucose, UA: NEGATIVE mg/dL
HGB URINE DIPSTICK: NEGATIVE
Ketones, ur: 15 mg/dL — AB
LEUKOCYTES UA: NEGATIVE
Nitrite: NEGATIVE
PROTEIN: NEGATIVE mg/dL
Specific Gravity, Urine: 1.033 — ABNORMAL HIGH (ref 1.005–1.030)
Urobilinogen, UA: 0.2 mg/dL (ref 0.0–1.0)
pH: 6 (ref 5.0–8.0)

## 2013-12-12 MED ORDER — NAPROXEN 375 MG PO TABS: 375.0000 mg | ORAL_TABLET | Freq: Two times a day (BID) | ORAL | Status: DC

## 2013-12-12 MED ORDER — METHOCARBAMOL 500 MG PO TABS
500.0000 mg | ORAL_TABLET | Freq: Once | ORAL | Status: AC
  Administered 2013-12-12: 500 mg via ORAL
  Filled 2013-12-12: qty 1

## 2013-12-12 MED ORDER — METHOCARBAMOL 500 MG PO TABS: 500.0000 mg | ORAL_TABLET | Freq: Two times a day (BID) | ORAL | Status: DC

## 2013-12-12 MED ORDER — IBUPROFEN 800 MG PO TABS
800.0000 mg | ORAL_TABLET | Freq: Once | ORAL | Status: AC
Start: 2013-12-12 — End: 2013-12-12
  Administered 2013-12-12: 800 mg via ORAL
  Filled 2013-12-12: qty 1

## 2013-12-12 NOTE — Discharge Instructions (Signed)
Cryotherapy °Cryotherapy means treatment with cold. Ice or gel packs can be used to reduce both pain and swelling. Ice is the most helpful within the first 24 to 48 hours after an injury or flare-up from overusing a muscle or joint. Sprains, strains, spasms, burning pain, shooting pain, and aches can all be eased with ice. Ice can also be used when recovering from surgery. Ice is effective, has very few side effects, and is safe for most people to use. °PRECAUTIONS  °Ice is not a safe treatment option for people with: °· Raynaud phenomenon. This is a condition affecting small blood vessels in the extremities. Exposure to cold may cause your problems to return. °· Cold hypersensitivity. There are many forms of cold hypersensitivity, including: °¨ Cold urticaria. Red, itchy hives appear on the skin when the tissues begin to warm after being iced. °¨ Cold erythema. This is a red, itchy rash caused by exposure to cold. °¨ Cold hemoglobinuria. Red blood cells break down when the tissues begin to warm after being iced. The hemoglobin that carry oxygen are passed into the urine because they cannot combine with blood proteins fast enough. °· Numbness or altered sensitivity in the area being iced. °If you have any of the following conditions, do not use ice until you have discussed cryotherapy with your caregiver: °· Heart conditions, such as arrhythmia, angina, or chronic heart disease. °· High blood pressure. °· Healing wounds or open skin in the area being iced. °· Current infections. °· Rheumatoid arthritis. °· Poor circulation. °· Diabetes. °Ice slows the blood flow in the region it is applied. This is beneficial when trying to stop inflamed tissues from spreading irritating chemicals to surrounding tissues. However, if you expose your skin to cold temperatures for too long or without the proper protection, you can damage your skin or nerves. Watch for signs of skin damage due to cold. °HOME CARE INSTRUCTIONS °Follow  these tips to use ice and cold packs safely. °· Place a dry or damp towel between the ice and skin. A damp towel will cool the skin more quickly, so you may need to shorten the time that the ice is used. °· For a more rapid response, add gentle compression to the ice. °· Ice for no more than 10 to 20 minutes at a time. The bonier the area you are icing, the less time it will take to get the benefits of ice. °· Check your skin after 5 minutes to make sure there are no signs of a poor response to cold or skin damage. °· Rest 20 minutes or more between uses. °· Once your skin is numb, you can end your treatment. You can test numbness by very lightly touching your skin. The touch should be so light that you do not see the skin dimple from the pressure of your fingertip. When using ice, most people will feel these normal sensations in this order: cold, burning, aching, and numbness. °· Do not use ice on someone who cannot communicate their responses to pain, such as small children or people with dementia. °HOW TO MAKE AN ICE PACK °Ice packs are the most common way to use ice therapy. Other methods include ice massage, ice baths, and cryosprays. Muscle creams that cause a cold, tingly feeling do not offer the same benefits that ice offers and should not be used as a substitute unless recommended by your caregiver. °To make an ice pack, do one of the following: °· Place crushed ice or a   bag of frozen vegetables in a sealable plastic bag. Squeeze out the excess air. Place this bag inside another plastic bag. Slide the bag into a pillowcase or place a damp towel between your skin and the bag. °· Mix 3 parts water with 1 part rubbing alcohol. Freeze the mixture in a sealable plastic bag. When you remove the mixture from the freezer, it will be slushy. Squeeze out the excess air. Place this bag inside another plastic bag. Slide the bag into a pillowcase or place a damp towel between your skin and the bag. °SEEK MEDICAL CARE  IF: °· You develop white spots on your skin. This may give the skin a blotchy (mottled) appearance. °· Your skin turns blue or pale. °· Your skin becomes waxy or hard. °· Your swelling gets worse. °MAKE SURE YOU:  °· Understand these instructions. °· Will watch your condition. °· Will get help right away if you are not doing well or get worse. °Document Released: 09/13/2010 Document Revised: 06/03/2013 Document Reviewed: 09/13/2010 °ExitCare® Patient Information ©2015 [employer]. This information is not intended to replace advice given to you by your health care provider. Make sure you discuss any questions you have with your health care provider. ° °

## 2013-12-12 NOTE — ED Provider Notes (Signed)
CSN: [id number]     Arrival date & time 12/12/13  0209 History   First MD Initiated Contact with Patient 12/12/13 0400     Chief Complaint  Patient presents with  . Back Pain     (Consider location/radiation/quality/duration/timing/severity/associated sxs/prior Treatment) Patient is a 23 y.o. male presenting with back pain. The history is provided by the patient.  Back Pain Location:  Sacro-iliac joint Quality:  Aching Radiates to:  Does not radiate Pain severity:  Moderate Pain is:  Same all the time Onset quality:  Gradual Timing:  Constant Progression:  Unchanged Chronicity:  New Context: not emotional stress, not falling, not jumping from heights, not lifting heavy objects, not MCA, not MVA, not occupational injury, not pedestrian accident, not physical stress, not recent illness, not recent injury and not twisting   Relieved by:  Nothing Worsened by:  Nothing tried Ineffective treatments:  None tried Associated symptoms: no abdominal pain, no abdominal swelling, no bladder incontinence, no bowel incontinence, no chest pain, no dysuria, no fever, no headaches, no leg pain, no numbness, no paresthesias, no pelvic pain, no perianal numbness, no tingling, no weakness and no weight loss   Risk factors: no hx of cancer   aLSO HAS A COUGH  History reviewed. No pertinent past medical history. Past Surgical History  Procedure Laterality Date  . Wisdom tooth extraction    . Shoulder surgery      librium   No family history on file. History  Substance Use Topics  . Smoking status: Current Every Day Smoker    Types: Cigarettes  . Smokeless tobacco: Not on file  . Alcohol Use: No    Review of Systems  Constitutional: Negative for fever and weight loss.  Respiratory: Positive for cough. Negative for shortness of breath and stridor.   Cardiovascular: Negative for chest pain, palpitations and leg swelling.  Gastrointestinal: Negative for abdominal pain and bowel incontinence.   Genitourinary: Negative for bladder incontinence, dysuria and pelvic pain.  Musculoskeletal: Positive for back pain.  Neurological: Negative for tingling, weakness, numbness, headaches and paresthesias.  All other systems reviewed and are negative.     Allergies  Bee venom and Shellfish allergy  Home Medications   Prior to Admission medications   Medication Sig Start Date End Date Taking? Authorizing Provider  promethazine (PHENERGAN) 12.5 MG tablet Take 1 tablet (12.5 mg total) by mouth every 6 (six) hours as needed for nausea or vomiting. 03/19/13   Roxy Horsemanobert Browning, PA-C   BP 144/95 mmHg  Pulse 81  Temp(Src) 97.9 F (36.6 C) (Oral)  Resp 16  Ht 5\' 10"  (1.778 m)  Wt 145 lb (65.772 kg)  BMI 20.81 kg/m2  SpO2 99% Physical Exam  Constitutional: He is oriented to person, place, and time. He appears well-developed and well-nourished.  HENT:  Head: Normocephalic and atraumatic.  Right Ear: External ear normal.  Mouth/Throat: Oropharynx is clear and moist.  Eyes: Conjunctivae and EOM are normal. Pupils are equal, round, and reactive to light.  Neck: Normal range of motion. Neck supple.  NO POINT TENDERNESS OF THE C T OR L SPINE  Cardiovascular: Normal rate, regular rhythm and intact distal pulses.   Pulmonary/Chest: Effort normal and breath sounds normal. No respiratory distress. He has no wheezes. He has no rales.  Abdominal: Soft. Bowel sounds are normal. There is no tenderness. There is no rebound and no guarding.  Musculoskeletal: Normal range of motion. He exhibits no edema or tenderness.  Neurological: He is alert and oriented to  person, place, and time. He has normal reflexes.  Skin: Skin is warm and dry.  Psychiatric: He has a normal mood and affect.    ED Course  Procedures (including critical care time) Labs Review Labs Reviewed  URINALYSIS, ROUTINE W REFLEX MICROSCOPIC - Abnormal; Notable for the following:    Specific Gravity, Urine 1.033 (*)    Ketones, ur 15  (*)    All other components within normal limits    Imaging Review No results found.   EKG Interpretation None      MDM   Final diagnoses:  None    PERC negative wells 0.  Pain in low back is strain.  Will treat with nsaids and muscle relaxants    Brennan Karam K Izear Pine-Rasch, MD 12/12/13 [phone]

## 2013-12-12 NOTE — ED Notes (Signed)
Patient transported to X-ray 

## 2013-12-12 NOTE — ED Notes (Signed)
Pt. reports right lower back pain onset last week , denies injury , ambulatory , no urinary discomfort or hematuria .

## 2014-02-04 ENCOUNTER — Emergency Department (HOSPITAL_COMMUNITY): Payer: Managed Care, Other (non HMO)

## 2014-02-04 ENCOUNTER — Emergency Department (HOSPITAL_COMMUNITY)
Admission: EM | Admit: 2014-02-04 | Discharge: 2014-02-04 | Disposition: A | Discharge status: Home / Self Care | Payer: Managed Care, Other (non HMO) | Attending: Emergency Medicine | Admitting: Emergency Medicine

## 2014-02-04 ENCOUNTER — Encounter (HOSPITAL_COMMUNITY): Payer: Self-pay | Admitting: Emergency Medicine

## 2014-02-04 DIAGNOSIS — R110 Nausea: Secondary | ICD-10-CM

## 2014-02-04 DIAGNOSIS — N50811 Right testicular pain: Secondary | ICD-10-CM

## 2014-02-04 DIAGNOSIS — Z720 Tobacco use: Secondary | ICD-10-CM | POA: Diagnosis not present

## 2014-02-04 DIAGNOSIS — N508 Other specified disorders of male genital organs: Secondary | ICD-10-CM | POA: Insufficient documentation

## 2014-02-04 DIAGNOSIS — R195 Other fecal abnormalities: Secondary | ICD-10-CM

## 2014-02-04 DIAGNOSIS — R1031 Right lower quadrant pain: Secondary | ICD-10-CM

## 2014-02-04 DIAGNOSIS — R197 Diarrhea, unspecified: Secondary | ICD-10-CM | POA: Diagnosis not present

## 2014-02-04 LAB — CBC WITH DIFFERENTIAL/PLATELET
Basophils Absolute: 0 10*3/uL (ref 0.0–0.1)
Basophils Relative: 0 % (ref 0–1)
EOS PCT: 3 % (ref 0–5)
Eosinophils Absolute: 0.2 10*3/uL (ref 0.0–0.7)
HEMATOCRIT: 50.7 % (ref 39.0–52.0)
HEMOGLOBIN: 17.5 g/dL — AB (ref 13.0–17.0)
LYMPHS ABS: 2.4 10*3/uL (ref 0.7–4.0)
LYMPHS PCT: 31 % (ref 12–46)
MCH: 31.1 pg (ref 26.0–34.0)
MCHC: 34.5 g/dL (ref 30.0–36.0)
MCV: 90.2 fL (ref 78.0–100.0)
MONO ABS: 0.5 10*3/uL (ref 0.1–1.0)
Monocytes Relative: 7 % (ref 3–12)
NEUTROS ABS: 4.6 10*3/uL (ref 1.7–7.7)
Neutrophils Relative %: 59 % (ref 43–77)
Platelets: 168 10*3/uL (ref 150–400)
RBC: 5.62 MIL/uL (ref 4.22–5.81)
RDW: 11.7 % (ref 11.5–15.5)
WBC: 7.8 10*3/uL (ref 4.0–10.5)

## 2014-02-04 LAB — URINALYSIS, ROUTINE W REFLEX MICROSCOPIC
Bilirubin Urine: NEGATIVE
GLUCOSE, UA: NEGATIVE mg/dL
HGB URINE DIPSTICK: NEGATIVE
KETONES UR: NEGATIVE mg/dL
Leukocytes, UA: NEGATIVE
Nitrite: NEGATIVE
PH: 6 (ref 5.0–8.0)
PROTEIN: NEGATIVE mg/dL
Specific Gravity, Urine: 1.021 (ref 1.005–1.030)
Urobilinogen, UA: 0.2 mg/dL (ref 0.0–1.0)

## 2014-02-04 LAB — COMPREHENSIVE METABOLIC PANEL
ALK PHOS: 68 U/L (ref 39–117)
ALT: 26 U/L (ref 0–53)
ANION GAP: 10 (ref 5–15)
AST: 32 U/L (ref 0–37)
Albumin: 5.2 g/dL (ref 3.5–5.2)
BILIRUBIN TOTAL: 0.8 mg/dL (ref 0.3–1.2)
BUN: 11 mg/dL (ref 6–23)
CHLORIDE: 101 meq/L (ref 96–112)
CO2: 27 mmol/L (ref 19–32)
Calcium: 9.7 mg/dL (ref 8.4–10.5)
Creatinine, Ser: 1.22 mg/dL (ref 0.50–1.35)
GFR calc Af Amer: >90 mL/min (ref >90)
GFR calc non Af Amer: 84 mL/min — ABNORMAL LOW (ref >90)
GLUCOSE: 95 mg/dL (ref 70–99)
POTASSIUM: 3.9 mmol/L (ref 3.5–5.1)
SODIUM: 138 mmol/L (ref 135–145)
TOTAL PROTEIN: 8.9 g/dL — AB (ref 6.0–8.3)

## 2014-02-04 LAB — LIPASE, BLOOD: LIPASE: 23 U/L (ref 11–59)

## 2014-02-04 MED ORDER — SODIUM CHLORIDE 0.9 % IV BOLUS (SEPSIS)
1000.0000 mL | Freq: Once | INTRAVENOUS | Status: AC
  Administered 2014-02-04: 1000 mL via INTRAVENOUS

## 2014-02-04 MED ORDER — ONDANSETRON 8 MG PO TBDP: ORAL_TABLET | ORAL | Status: DC

## 2014-02-04 MED ORDER — MORPHINE SULFATE 4 MG/ML IJ SOLN
4.0000 mg | Freq: Once | INTRAMUSCULAR | Status: AC
  Administered 2014-02-04: 4 mg via INTRAVENOUS
  Filled 2014-02-04: qty 1

## 2014-02-04 MED ORDER — HYDROCODONE-ACETAMINOPHEN 5-325 MG PO TABS: 1.0000 | ORAL_TABLET | ORAL | Status: DC | PRN

## 2014-02-04 MED ORDER — ONDANSETRON HCL 4 MG/2ML IJ SOLN
4.0000 mg | Freq: Once | INTRAMUSCULAR | Status: AC
  Administered 2014-02-04: 4 mg via INTRAVENOUS
  Filled 2014-02-04: qty 2

## 2014-02-04 MED ORDER — IOHEXOL 300 MG/ML  SOLN
100.0000 mL | Freq: Once | INTRAMUSCULAR | Status: AC | PRN
  Administered 2014-02-04: 100 mL via INTRAVENOUS

## 2014-02-04 MED ORDER — IOHEXOL 300 MG/ML  SOLN
25.0000 mL | Freq: Once | INTRAMUSCULAR | Status: AC | PRN
  Administered 2014-02-04: 25 mL via ORAL

## 2014-02-04 NOTE — Discharge Instructions (Signed)
1. Medications: zofran, vicodin, usual home medications 2. Treatment: rest, drink plenty of fluids, advance diet slowly 3. Follow Up: Please followup with your primary doctor in 2 days for discussion of your diagnoses and further evaluation after today's visit; if you do not have a primary care doctor use the resource guide provided to find one; Please return to the ER for persistent vomiting, high fevers or worsening symptoms    Abdominal Pain Many things can cause abdominal pain. Usually, abdominal pain is not caused by a disease and will improve without treatment. It can often be observed and treated at home. Your health care provider will do a physical exam and possibly order blood tests and X-rays to help determine the seriousness of your pain. However, in many cases, more time must pass before a clear cause of the pain can be found. Before that point, your health care provider may not know if you need more testing or further treatment. HOME CARE INSTRUCTIONS  Monitor your abdominal pain for any changes. The following actions may help to alleviate any discomfort you are experiencing:  Only take over-the-counter or prescription medicines as directed by your health care provider.  Do not take laxatives unless directed to do so by your health care provider.  Try a clear liquid diet (broth, tea, or water) as directed by your health care provider. Slowly move to a bland diet as tolerated. SEEK MEDICAL CARE IF:  You have unexplained abdominal pain.  You have abdominal pain associated with nausea or diarrhea.  You have pain when you urinate or have a bowel movement.  You experience abdominal pain that wakes you in the night.  You have abdominal pain that is worsened or improved by eating food.  You have abdominal pain that is worsened with eating fatty foods.  You have a fever. SEEK IMMEDIATE MEDICAL CARE IF:   Your pain does not go away within 2 hours.  You keep throwing up  (vomiting).  Your pain is felt only in portions of the abdomen, such as the right side or the left lower portion of the abdomen.  You pass bloody or black tarry stools. MAKE SURE YOU:  Understand these instructions.   Will watch your condition.   Will get help right away if you are not doing well or get worse.  Document Released: 10/27/2004 Document Revised: 01/22/2013 Document Reviewed: 09/26/2012 [facility] Patient Information 2015 [place] [place]. This information is not intended to replace advice given to you by your health care provider. Make sure you discuss any questions you have with your health care provider.   Emergency Department Resource Guide 1) Find a Doctor and Pay Out of Pocket Although you won't have to find out who is covered by your insurance plan, it is a good idea to ask around and get recommendations. You will then need to call the office and see if the doctor you have chosen will accept you as a new patient and what types of options they offer for patients who are self-pay. Some doctors offer discounts or will set up payment plans for their patients who do not have insurance, but you will need to ask so you aren't surprised when you get to your appointment.  2) Contact Your Local Health Department Not all health departments have doctors that can see patients for sick visits, but many do, so it is worth a call to see if yours does. If you don't know where your local health department is, you can check in  your phone book. The CDC also has a tool to help you locate your state's health department, and many state websites also have listings of all of their local health departments.  3) Find a Walk-in Clinic If your illness is not likely to be very severe or complicated, you may want to try a walk in clinic. These are popping up all over the country in pharmacies, drugstores, and shopping centers. They're usually staffed by nurse practitioners or physician assistants that  have been trained to treat common illnesses and complaints. They're usually fairly quick and inexpensive. However, if you have serious medical issues or chronic medical problems, these are probably not your best option.  No Primary Care Doctor: - Call Health Connect at  [phone] - they can help you locate a primary care doctor that  accepts your insurance, provides certain services, etc. - Physician Referral Service- [phone]  Chronic Pain Problems: Organization         Address  Phone   Notes  [facility]  [phone] Patients need to be referred by their primary care doctor.   Medication Assistance: Organization         Address  Phone   Notes  [facility] Medication [facility] [address] [place] [place], [place] [address] [phone] --Must be a resident of [facility] -- Must have NO insurance coverage whatsoever (no Medicaid/ Medicare, etc.) -- The pt. MUST have a primary care doctor that directs their care regularly and follows them in the community   [facility]  [phone]   [employer]  [phone]    Agencies that provide inexpensive medical care: Organization         Address  Phone   Notes  [facility]  [phone]   [facility]    [phone]   [facility] [address] [place], [place] [address] [phone]   [facility] [address], [place] [phone]   Planned Parenthood    [phone]   [facility]    [phone]   [facility]  [address], [place] Phone:  [phone], Fax:  [phone] Hours of Operation:  9 am - 6 pm, M-F.  Also accepts Medicaid/Medicare and self-pay.  [facility]  [address], [place] Phone: [phone], Fax: [phone]. Hours of Operation:  8:30 am - 5:30 pm, M-F.  Also accepts Medicaid and  self-pay.  [facility] [place] [address] [place] Phone: [phone]   [facility] [address] Natasha Bence [place], [place] [phone], Ext. [phone] Mondays & Thursdays: 7-9 AM.  First 15 patients are seen on a first come, first serve basis.    Medicaid-accepting [facility] Providers:  Organization         Address  Phone   Notes  [facility] [address], [place] [phone] Also accepts self-pay patients.  [facility] [address] Laurell Josephs [place], [place]  [phone]   [facility] [address], [place] [phone]   [facility] [address] [place] [phone]   Renaye Rakers [address], [place]   [phone] Only accepts [place] Access [place] patients after they have their name applied to their card.  Self-Pay (no insurance) in [facility]:  Organization         Address  Phone   Notes  Sickle Cell Patients, [facility] [address], [place] [phone]) [phone]   [facility] [address], [place] [phone]) [phone]   [facility] [place]  [address], [place] [phone]   [facility]/Dr. Osei-Bonsu  [address] [place] [address], [place] or [phone], [place] [phone] Phone number for both [place] and [place] locations is the same.  [facility] [address], [place] [phone]   [facility] [address] [place] [address], [place] or [address] [phone]) [phone]   [facility] [address], [place] [phone]) [phone], phone; [phone], fax Sees patients 1st and 3rd Saturday of every month.  Must not qualify for public or private insurance (i.e. Medicaid, Medicare, [place] Health Choice, Veterans' Benefits)  Household income  should be no more than 200% of the poverty level The clinic cannot treat you if you are pregnant or think you are pregnant  Sexually transmitted diseases are not treated at the clinic.    Dental Care: Organization         Address  Phone  Notes  [facility] [address], [place] [phone] Accepts children up to age 22 who are enrolled in [place] or [place] Health Choice; pregnant women with a Medicaid card; and children who have applied for Medicaid or [place] Health Choice, but were declined, whose parents can pay a reduced fee at time of service.  [facility] [place]  [address], [place] [phone] Accepts children up to age 22 who are enrolled in [place] or [place] Health Choice; pregnant women with a Medicaid card; and children who have applied for Medicaid or  Health Choice, but were declined, whose parents can pay a reduced fee at time of service.  [facility]  [address], [place] [phone] Patients are seen by appointment only. Walk-ins are not accepted. [facility] will see patients 22 years of age and older. Monday - Tuesday (8am-5pm) Most Wednesdays (8:30-5pm) $30 per visit, cash only  [facility]  [address], [facility] [place] [phone] Patients are seen by appointment only. Walk-ins are not accepted. [facility] will see patients 22 years of age and older. One Wednesday Evening (Monthly: Volunteer Based).  $30 per visit, cash only  [facility]  [phone] for adults; Children under age 624, call Graduate Pediatric Dentistry at [phone]. Children aged 154-14, please call [phone] to request a pediatric application.  Dental services are provided in all areas of dental care including fillings, crowns and bridges, complete and partial dentures, implants, gum treatment,  root canals, and extractions. Preventive care is also provided. Treatment is provided to both adults and children. Patients are selected via a lottery and there is often a waiting list.   [facility] [address], [place]  [phone] [email or web address]   [facility] [address], [place], [place] [phone], Ext. [phone] Second and Fourth Thursday of each month, opens at 6:30 AM; Clinic ends at 9 AM.  Patients are seen on a first-come first-served basis, and a limited number  are seen during each clinic.   [facility]  [address] Ether Griffins [place], [place] [phone]   Eligibility Requirements You must have lived in [place], [place], or [place] counties for at least the last three months.   You cannot be eligible for state or federal sponsored [place], including [employer], [place], or [employer].   You generally cannot be eligible for healthcare insurance through your employer.    How to apply: Eligibility screenings are held every Tuesday and Wednesday afternoon from 1:00 pm until 4:00 pm. You do not need an appointment for the interview!  [facility] [address] [place], [place] [phone]   [facility] Health Department  [phone]   [facility] Health Department  [phone]   [facility] Health Department  [phone]    Behavioral Health Resources in the Community: Intensive Outpatient Programs Organization         Address  Phone  Notes  [facility] [address] [place], [place] [phone]   [facility] Outpatient [address], [place], [place] [phone]   [facility] [address], [place], [place]  [phone]   [facility] [address]  [place], [place] [phone] or [phone]   Substance Abuse Resources Organization         Address  Phone  Notes  [facility]   [phone]   [facility]  [phone]   [facility]  [phone]   Floydene Flock  [phone]   Residential & Outpatient Substance Abuse Program  6715161246   Psychological Services Organization         Address  Phone  Notes  [facility]  [phone]   [facility]  [phone]   [facility] Mental Health [address] [place] [phone] or [phone]    Mobile Crisis Teams Organization         Address  Phone  Notes  [facility], Mobile Crisis Care Unit  [phone]   [facility]  [address] [place], [place] [phone]   Doristine Locks [address] [place] [phone]    Self-Help/Support Groups Organization         Address  Phone             Notes  [facility] [place] - variety of support groups  [phone]- I7437963 Call for more information  Narcotics Anonymous (NA), [facility] [address], [employer] [place]  2 meetings at this location   Statistician         Address  Phone  Notes  [facility] [address],    [place]  [phone]   [facility]  [address], [place] [address], [place], [place] [phone]   [facility] [address] [place], [place] [phone] Admissions: 8am-3pm M-F  [facility] [address]    [place], [place] [phone]   [facility] [address] Starling Manns [place], [place] [phone]   [facility] [address],  [place], [place] [phone]   [facility] - Intensive Outpatient [address]urell [address], [place], [place] [phone]   [facility] [address]  [place], [place] [phone] or [phone]   [facility] ([facility]) [address], [place], [place] [phone] Accepts Medicaid  [facility] [address]  [place] [phone] Substance Abuse/Addiction Treatment   [facility] Organization         Address  Phone  Notes  [facility]  [phone]   Domenic Schwab, PhD [address] Arlis Porta [place], [place]   [phone] or [phone]   [place], [place] [phone]   [facility] [address], [place], [place] [phone] Insurance/Medicaid/sponsorship through [facility] and Families [address]                                    [place], [place] [phone] [place] [address], [place] [phone]    Dr. Adele Schilder  [phone]   [facility] Dept. 1) [address] [place] 2) [place] 3)  [address], [place] [phone]  [phone]   [place] [phone] or [phone] (After Hours)

## 2014-02-04 NOTE — ED Notes (Signed)
Ultrasound at bedside

## 2014-02-04 NOTE — ED Notes (Signed)
Pt from  Home c/o lower right sided abdominal pain and diarrhea. He also c/o difficulty urinating all for 1-2 weeks

## 2014-02-04 NOTE — ED Notes (Signed)
Hannah, PA at bedside  

## 2014-02-04 NOTE — ED Provider Notes (Signed)
CSN: [id number]     Arrival date & time 02/04/14  1418 History   First MD Initiated Contact with Patient 02/04/14 1502     Chief Complaint  Patient presents with  . Abdominal Pain  . Diarrhea     (Consider location/radiation/quality/duration/timing/severity/associated sxs/prior Treatment) The history is provided by the patient and medical records. No language interpreter was used.     Andrew Hampton is a 24 y.o. male  with no major medical history presents to the Emergency Department complaining of gradual, persistent, progressively worsening right lower quadrant abdominal pain with associated right flank pain onset this morning.  Patient reports he's had associated low back pain for several months without relief from Robaxin and naproxen. He reports that he has had urinary urgency for approximately one week without dysuria, hematuria, frequency or penile discharge. He reports that when his right lower quadrant abdominal pain began this morning he also experienced right testicular pain.  He denies penile pain or penile discharge.  He denies history of kidney stones.  He reports nausea without vomiting and 2 episodes of loose stools this morning. Patient has no history of abdominal surgery. No treatments prior to arrival. No aggravating or alleviating factors. Patient denies fever, chills, headache, neck pain or chest pain, shortness of breath, vomiting, weakness, dizziness, syncope, dysuria, hematuria.  No hematochezia, melena or hematemesis.  History reviewed. No pertinent past medical history. Past Surgical History  Procedure Laterality Date  . Wisdom tooth extraction    . Shoulder surgery      librium   No family history on file. History  Substance Use Topics  . Smoking status: Current Every Day Smoker    Types: Cigarettes  . Smokeless tobacco: Not on file  . Alcohol Use: No    Review of Systems  Constitutional: Negative for fever, diaphoresis, appetite change, fatigue and  unexpected weight change.  HENT: Negative for mouth sores and trouble swallowing.   Respiratory: Negative for cough, chest tightness, shortness of breath, wheezing and stridor.   Cardiovascular: Negative for chest pain and palpitations.  Gastrointestinal: Positive for nausea, abdominal pain and diarrhea ( 2 episodes of loose stools). Negative for vomiting, constipation, blood in stool, abdominal distention and rectal pain.  Genitourinary: Positive for urgency and testicular pain. Negative for dysuria, frequency, hematuria, flank pain and difficulty urinating.  Musculoskeletal: Negative for back pain, neck pain and neck stiffness.  Skin: Negative for rash.  Neurological: Negative for weakness.  Hematological: Negative for adenopathy.  Psychiatric/Behavioral: Negative for confusion.  All other systems reviewed and are negative.      Allergies  Bee venom and Shellfish allergy  Home Medications   Prior to Admission medications   Medication Sig Start Date End Date Taking? Authorizing Provider  HYDROcodone-acetaminophen (NORCO/VICODIN) 5-325 MG per tablet Take 1-2 tablets by mouth every 4 (four) hours as needed for moderate pain or severe pain. 02/04/14   Karlena Luebke, PA-C  methocarbamol (ROBAXIN) 500 MG tablet Take 1 tablet (500 mg total) by mouth 2 (two) times daily. Patient not taking: Reported on 02/04/2014 12/12/13   April K Palumbo-Rasch, MD  naproxen (NAPROSYN) 375 MG tablet Take 1 tablet (375 mg total) by mouth 2 (two) times daily. Patient not taking: Reported on 02/04/2014 12/12/13   April K Palumbo-Rasch, MD  ondansetron [facility] ODT) 8 MG disintegrating tablet  ODT q4 hours prn nausea 02/04/14   Dahlia Client Necole Minassian, PA-C  promethazine (PHENERGAN) 12.5 MG tablet Take 1 tablet (12.5 mg total) by mouth every 6 (six) hours  as needed for nausea or vomiting. Patient not taking: Reported on 02/04/2014 03/19/13   Roxy Horseman, PA-C   BP 148/73 mmHg  Pulse 87  Temp(Src) 98.3 F (36.8  C) (Oral)  Resp 16  Wt 145 lb (65.772 kg)  SpO2 100% Physical Exam  Constitutional: He appears well-developed and well-nourished. No distress.  Awake, alert, nontoxic appearance  HENT:  Head: Normocephalic and atraumatic.  Mouth/Throat: Oropharynx is clear and moist. No oropharyngeal exudate.  Eyes: Conjunctivae are normal. No scleral icterus.  Neck: Normal range of motion. Neck supple.  Cardiovascular: Normal rate, regular rhythm, normal heart sounds and intact distal pulses.   No murmur heard. Pulmonary/Chest: Effort normal and breath sounds normal. No respiratory distress. He has no wheezes.  Equal chest expansion  Abdominal: Soft. Bowel sounds are normal. He exhibits no distension and no mass. There is tenderness. There is guarding. There is no rebound and no CVA tenderness. Hernia confirmed negative in the right inguinal area and confirmed negative in the left inguinal area.  Right lower quadrant abdominal pain with guarding No rebound or peritoneal signs His CVA tenderness  Genitourinary: Testes normal and penis normal. Right testis shows no mass, no swelling and no tenderness. Right testis is descended. Cremasteric reflex is not absent on the right side. Left testis shows no mass, no swelling and no tenderness. Left testis is descended. Cremasteric reflex is not absent on the left side. Circumcised. No phimosis, paraphimosis, hypospadias, penile erythema or penile tenderness. No discharge found.  No tenderness palpation of the testes No hernia or adenopathy  Musculoskeletal: Normal range of motion. He exhibits no edema.  Lymphadenopathy:       Right: No inguinal adenopathy present.       Left: No inguinal adenopathy present.  Neurological: He is alert. He exhibits normal muscle tone. Coordination normal.  Speech is clear and goal oriented Moves extremities without ataxia  Skin: Skin is warm and dry. He is not diaphoretic. No erythema.  Psychiatric: He has a normal mood and  affect.  Nursing note and vitals reviewed.   ED Course  Procedures (including critical care time) Labs Review Labs Reviewed  CBC WITH DIFFERENTIAL - Abnormal; Notable for the following:    Hemoglobin 17.5 (*)    All other components within normal limits  COMPREHENSIVE METABOLIC PANEL - Abnormal; Notable for the following:    Total Protein 8.9 (*)    GFR calc non Af Amer 84 (*)    All other components within normal limits  URINALYSIS, ROUTINE W REFLEX MICROSCOPIC - Abnormal; Notable for the following:    APPearance CLOUDY (*)    All other components within normal limits  LIPASE, BLOOD    Imaging Review US Scrotum  02/04/2014   CLINICAL DATA:  Right testicular pain for 2 weeks  EXAM: SCROTAL ULTRASOUND  DOPPLER ULTRASOUND OF THE TESTICLES  TECHNIQUE: Complete ultrasound examination of the testicles, epididymis, and other scrotal structures was performed. Color and spectral Doppler ultrasound were also utilized to evaluate blood flow to the testicles.  COMPARISON:  CT 03/20/2013  FINDINGS: Right testicle  Measurements: 46 x 23 x 23 mm. No mass or microlithiasis visualized.  Left testicle  Measurements: 43 x 20 x 24 mm. No mass or microlithiasis visualized.  Right epididymis:  There is a 5 mm cyst in the epididymal head.  Left epididymis: 4 mm cyst in the epididymal head, otherwise unremarkable  Hydrocele:  None seen  Varicocele:  None visualized.  Pulsed Doppler interrogation of both testes  demonstrates low resistance arterial and venous waveforms bilaterally.  IMPRESSION: 1. Normal testes. 2. Small bilateral epididymal cysts.   Electronically Signed   By: Oley Balm M.D.   On: 02/04/2014 16:34   Ct Abdomen Pelvis W Contrast  02/04/2014   CLINICAL DATA:  Right-sided abdominal pain for 1 day  EXAM: CT ABDOMEN AND PELVIS WITH CONTRAST  TECHNIQUE: Multidetector CT imaging of the abdomen and pelvis was performed using the standard protocol following bolus administration of intravenous contrast.   CONTRAST:  25mL OMNIPAQUE IOHEXOL 300 MG/ML SOLN, OMNIPAQUE IOHEXOL 300 MG/ML SOLN  COMPARISON:  03/20/2013  FINDINGS: Lung bases are free of acute infiltrate or sizable effusion.  The liver, gallbladder, spleen, adrenal glands and pancreas are all normal in their CT appearance. The kidneys are well visualized bilaterally without renal calculi or urinary tract obstructive changes. The appendix is air-filled. The previously seen appendicolith is not well visualized. No free pelvic fluid is seen. The bladder is partially distended. No significant lymphadenopathy is seen. No bony abnormality is noted.  IMPRESSION: No acute abnormality identified.   Electronically Signed   By: Alcide Clever M.D.   On: 02/04/2014 19:31   [place] Art/ven Flow Abd Pelv Doppler  02/04/2014   CLINICAL DATA:  Right testicular pain for 2 weeks  EXAM: SCROTAL ULTRASOUND  DOPPLER ULTRASOUND OF THE TESTICLES  TECHNIQUE: Complete ultrasound examination of the testicles, epididymis, and other scrotal structures was performed. Color and spectral Doppler ultrasound were also utilized to evaluate blood flow to the testicles.  COMPARISON:  CT 03/20/2013  FINDINGS: Right testicle  Measurements: 46 x 23 x 23 mm. No mass or microlithiasis visualized.  Left testicle  Measurements: 43 x 20 x 24 mm. No mass or microlithiasis visualized.  Right epididymis:  There is a 5 mm cyst in the epididymal head.  Left epididymis: 4 mm cyst in the epididymal head, otherwise unremarkable  Hydrocele:  None seen  Varicocele:  None visualized.  Pulsed Doppler interrogation of both testes demonstrates low resistance arterial and venous waveforms bilaterally.  IMPRESSION: 1. Normal testes. 2. Small bilateral epididymal cysts.   Electronically Signed   By: Oley Balm M.D.   On: 02/04/2014 16:34     EKG Interpretation None      MDM   Final diagnoses:  Right testicular pain  RLQ abdominal pain  Nausea  Loose stools   Andrew Hampton presents with RLQ abd  pain and right testicular pain beginning this morning.  Pt well appearing.  Will obtain CT scan and scrotal US.    7:56 PM Urinalysis with evidence of urinary tract infection. Doubt STD or prostatitis. CBC without leukocytosis, mildly elevated hemoglobin, likely hemoconcentrated. CMP reassuring and lipase within normal limits. Ultrasound of scrotum without evidence of torsion, orchitis or epididymitis. CT scan for evidence of appendicitis, colitis, diverticulitis, cholecystitis or other concerning findings. No emesis here in the emergency department patient is tolerating by mouth without difficulty. Patient remains without diarrhea here in the emergency department either.  Patient is nontoxic, nonseptic appearing, in no apparent distress.  Patient's pain and other symptoms adequately managed in emergency department.  Fluid bolus given.  Labs, imaging and vitals reviewed.  Patient does not meet the SIRS or Sepsis criteria.  On repeat exam patient does not have a surgical abdomin and there are no peritoneal signs.  No indication of appendicitis, bowel obstruction, bowel perforation, cholecystitis, diverticulitis.  Patient discharged home with symptomatic treatment and given strict instructions for follow-up with their primary  care physician.   I have personally reviewed patient's vitals, nursing note and any pertinent labs or imaging.  I performed an focused physical exam; undressed when appropriate .    It has been determined that no acute conditions requiring further emergency intervention are present at this time. The patient/guardian have been advised of the diagnosis and plan. I reviewed any labs and imaging including any potential incidental findings. We have discussed signs and symptoms that warrant return to the ED and they are listed in the discharge instructions.    Vital signs are stable at discharge.   BP 148/73 mmHg  Pulse 87  Temp(Src) 98.3 F (36.8 C) (Oral)  Resp 16  Wt 145 lb (65.772  kg)  SpO2 100%          Dierdre ForthHannah Harlan Ervine, PA-C 02/04/14 1957  Elwin MochaBlair Walden, MD 02/04/14 2344

## 2014-07-15 ENCOUNTER — Emergency Department (HOSPITAL_COMMUNITY)
Admission: EM | Admit: 2014-07-15 | Discharge: 2014-07-16 | Disposition: A | Discharge status: Home / Self Care | Payer: Managed Care, Other (non HMO) | Attending: Emergency Medicine | Admitting: Emergency Medicine

## 2014-07-15 ENCOUNTER — Emergency Department (HOSPITAL_COMMUNITY): Payer: Managed Care, Other (non HMO)

## 2014-07-15 ENCOUNTER — Encounter (HOSPITAL_COMMUNITY): Payer: Self-pay | Admitting: Emergency Medicine

## 2014-07-15 DIAGNOSIS — Z720 Tobacco use: Secondary | ICD-10-CM | POA: Diagnosis not present

## 2014-07-15 DIAGNOSIS — Z791 Long term (current) use of non-steroidal anti-inflammatories (NSAID): Secondary | ICD-10-CM | POA: Diagnosis not present

## 2014-07-15 DIAGNOSIS — W57XXXA Bitten or stung by nonvenomous insect and other nonvenomous arthropods, initial encounter: Secondary | ICD-10-CM | POA: Diagnosis not present

## 2014-07-15 DIAGNOSIS — R1031 Right lower quadrant pain: Secondary | ICD-10-CM | POA: Insufficient documentation

## 2014-07-15 DIAGNOSIS — Y999 Unspecified external cause status: Secondary | ICD-10-CM | POA: Diagnosis not present

## 2014-07-15 DIAGNOSIS — Y939 Activity, unspecified: Secondary | ICD-10-CM | POA: Insufficient documentation

## 2014-07-15 DIAGNOSIS — S80262A Insect bite (nonvenomous), left knee, initial encounter: Secondary | ICD-10-CM | POA: Insufficient documentation

## 2014-07-15 DIAGNOSIS — N390 Urinary tract infection, site not specified: Secondary | ICD-10-CM | POA: Diagnosis not present

## 2014-07-15 DIAGNOSIS — Z79899 Other long term (current) drug therapy: Secondary | ICD-10-CM | POA: Diagnosis not present

## 2014-07-15 DIAGNOSIS — Y929 Unspecified place or not applicable: Secondary | ICD-10-CM | POA: Insufficient documentation

## 2014-07-15 DIAGNOSIS — R8281 Pyuria: Secondary | ICD-10-CM

## 2014-07-15 LAB — COMPREHENSIVE METABOLIC PANEL
ALBUMIN: 4.6 g/dL (ref 3.5–5.0)
ALK PHOS: 63 U/L (ref 38–126)
ALT: 20 U/L (ref 17–63)
AST: 29 U/L (ref 15–41)
Anion gap: 8 (ref 5–15)
BUN: 13 mg/dL (ref 6–20)
CALCIUM: 9.3 mg/dL (ref 8.9–10.3)
CHLORIDE: 105 mmol/L (ref 101–111)
CO2: 26 mmol/L (ref 22–32)
CREATININE: 1.29 mg/dL — AB (ref 0.61–1.24)
GFR calc Af Amer: >60 mL/min (ref >60)
GFR calc non Af Amer: >60 mL/min (ref >60)
Glucose, Bld: 74 mg/dL (ref 65–99)
Potassium: 3.3 mmol/L — ABNORMAL LOW (ref 3.5–5.1)
Sodium: 139 mmol/L (ref 135–145)
Total Bilirubin: 1.1 mg/dL (ref 0.3–1.2)
Total Protein: 7.8 g/dL (ref 6.5–8.1)

## 2014-07-15 LAB — CBC WITH DIFFERENTIAL/PLATELET
BASOS ABS: 0 10*3/uL (ref 0.0–0.1)
BASOS PCT: 0 % (ref 0–1)
Eosinophils Absolute: 0.1 10*3/uL (ref 0.0–0.7)
Eosinophils Relative: 1 % (ref 0–5)
HCT: 45.2 % (ref 39.0–52.0)
HEMOGLOBIN: 16.1 g/dL (ref 13.0–17.0)
LYMPHS PCT: 34 % (ref 12–46)
Lymphs Abs: 3.2 10*3/uL (ref 0.7–4.0)
MCH: 32.1 pg (ref 26.0–34.0)
MCHC: 35.6 g/dL (ref 30.0–36.0)
MCV: 90.2 fL (ref 78.0–100.0)
Monocytes Absolute: 0.7 10*3/uL (ref 0.1–1.0)
Monocytes Relative: 8 % (ref 3–12)
NEUTROS ABS: 5.3 10*3/uL (ref 1.7–7.7)
NEUTROS PCT: 57 % (ref 43–77)
Platelets: 150 10*3/uL (ref 150–400)
RBC: 5.01 MIL/uL (ref 4.22–5.81)
RDW: 11.4 % — AB (ref 11.5–15.5)
WBC: 9.3 10*3/uL (ref 4.0–10.5)

## 2014-07-15 LAB — URINALYSIS, ROUTINE W REFLEX MICROSCOPIC
BILIRUBIN URINE: NEGATIVE
GLUCOSE, UA: NEGATIVE mg/dL
Hgb urine dipstick: NEGATIVE
Ketones, ur: NEGATIVE mg/dL
NITRITE: NEGATIVE
PH: 6.5 (ref 5.0–8.0)
Protein, ur: NEGATIVE mg/dL
SPECIFIC GRAVITY, URINE: 1.025 (ref 1.005–1.030)
Urobilinogen, UA: 1 mg/dL (ref 0.0–1.0)

## 2014-07-15 LAB — LIPASE, BLOOD: LIPASE: 22 U/L (ref 22–51)

## 2014-07-15 LAB — URINE MICROSCOPIC-ADD ON

## 2014-07-15 MED ORDER — MORPHINE SULFATE 4 MG/ML IJ SOLN
4.0000 mg | Freq: Once | INTRAMUSCULAR | Status: AC
  Administered 2014-07-15: 4 mg via INTRAVENOUS
  Filled 2014-07-15: qty 1

## 2014-07-15 MED ORDER — SODIUM CHLORIDE 0.9 % IV SOLN
1000.0000 mL | INTRAVENOUS | Status: DC
  Administered 2014-07-15: 1000 mL via INTRAVENOUS

## 2014-07-15 MED ORDER — ONDANSETRON HCL 4 MG/2ML IJ SOLN
4.0000 mg | Freq: Once | INTRAMUSCULAR | Status: AC
  Administered 2014-07-15: 4 mg via INTRAVENOUS
  Filled 2014-07-15: qty 2

## 2014-07-15 MED ORDER — SODIUM CHLORIDE 0.9 % IV SOLN
1000.0000 mL | Freq: Once | INTRAVENOUS | Status: AC
  Administered 2014-07-15: 1000 mL via INTRAVENOUS

## 2014-07-15 NOTE — ED Notes (Signed)
Pt cannot use restroom at this time, aware that urine specimen is needed.

## 2014-07-15 NOTE — ED Provider Notes (Signed)
CSN: [id number]     Arrival date & time 07/15/14  2031 History   First MD Initiated Contact with Patient 07/15/14 2040     Chief Complaint  Patient presents with  . Insect Bite  . Flank Pain    HPI Patient presents to the emergency room with complaints of pain in his right lower back. Pain is aching. It does not improve with NSAIDs. Symptoms started on Friday. He's had some generalized body aches is also had some trouble with nausea but no vomiting. He denies any dysuria. Denies any fevers but he has felt chilled. He also noticed a tick bite on his left knee and removed that within the last couple of days. Noticed any rashes. History reviewed. No pertinent past medical history. Past Surgical History  Procedure Laterality Date  . Wisdom tooth extraction    . Shoulder surgery      librium   History reviewed. No pertinent family history. History  Substance Use Topics  . Smoking status: Current Every Day Smoker    Types: Cigarettes  . Smokeless tobacco: Not on file  . Alcohol Use: No    Review of Systems  All other systems reviewed and are negative.     Allergies  Bee venom and Shellfish allergy  Home Medications   Prior to Admission medications   Medication Sig Start Date End Date Taking? Authorizing Provider  HYDROcodone-acetaminophen (NORCO/VICODIN) 5-325 MG per tablet Take 1-2 tablets by mouth every 4 (four) hours as needed for moderate pain or severe pain. Patient not taking: Reported on 07/15/2014 02/04/14   Dahlia Client Muthersbaugh, PA-C  methocarbamol (ROBAXIN) 500 MG tablet Take 1 tablet (500 mg total) by mouth 2 (two) times daily. Patient not taking: Reported on 02/04/2014 12/12/13   April Palumbo, MD  naproxen (NAPROSYN) 375 MG tablet Take 1 tablet (375 mg total) by mouth 2 (two) times daily. Patient not taking: Reported on 02/04/2014 12/12/13   April Palumbo, MD  ondansetron [facility] ODT) 8 MG disintegrating tablet  ODT q4 hours prn nausea Patient not taking: Reported on  07/15/2014 02/04/14   Dahlia Client Muthersbaugh, PA-C  promethazine (PHENERGAN) 12.5 MG tablet Take 1 tablet (12.5 mg total) by mouth every 6 (six) hours as needed for nausea or vomiting. Patient not taking: Reported on 02/04/2014 03/19/13   Roxy Horseman, PA-C   BP 135/83 mmHg  Pulse 84  Temp(Src) 98.5 F (36.9 C) (Oral)  Resp 18  SpO2 100% Physical Exam  Constitutional: He appears well-developed and well-nourished. No distress.  HENT:  Head: Normocephalic and atraumatic.  Right Ear: External ear normal.  Left Ear: External ear normal.  Eyes: Conjunctivae are normal. Right eye exhibits no discharge. Left eye exhibits no discharge. No scleral icterus.  Neck: Neck supple. No tracheal deviation present.  Cardiovascular: Normal rate, regular rhythm and intact distal pulses.   Pulmonary/Chest: Effort normal and breath sounds normal. No stridor. No respiratory distress. He has no wheezes. He has no rales.  Abdominal: Soft. Bowel sounds are normal. He exhibits no distension. There is no tenderness. There is CVA tenderness (rigth). There is no rebound and no guarding.  Musculoskeletal: He exhibits no edema or tenderness.  Neurological: He is alert. He has normal strength. No cranial nerve deficit (no facial droop, extraocular movements intact, no slurred speech) or sensory deficit. He exhibits normal muscle tone. He displays no seizure activity. Coordination normal.  Skin: Skin is warm and dry. No petechiae, no purpura and no rash noted. Rash is not macular and not maculopapular.  Psychiatric: He has a normal mood and affect.  Nursing note and vitals reviewed.   ED Course  Procedures (including critical care time) Labs Review Labs Reviewed  CBC WITH DIFFERENTIAL/PLATELET - Abnormal; Notable for the following:    RDW 11.4 (*)    All other components within normal limits  COMPREHENSIVE METABOLIC PANEL - Abnormal; Notable for the following:    Potassium 3.3 (*)    Creatinine, Ser 1.29 (*)    All  other components within normal limits  URINALYSIS, ROUTINE W REFLEX MICROSCOPIC (NOT AT [facility]) - Abnormal; Notable for the following:    Leukocytes, UA SMALL (*)    All other components within normal limits  URINE CULTURE  LIPASE, BLOOD  URINE MICROSCOPIC-ADD ON  HIV ANTIBODY (ROUTINE TESTING)  RPR  GC/CHLAMYDIA PROBE AMP ([place]) NOT AT [facility]     MDM   Final diagnoses:  RLQ abdominal pain    Patient's still has some tenderness palpation in the right lower quadrant. He does have pyuria without bacteriuria. Patient states he was treated for a possible UTI recently by his primary doctor.  Will ct abdomen to evaluate for possible appendicitis.  If negative would consider empiric abx for his tick bite.    Linwood Dibbles, MD 07/15/14 [phone]

## 2014-07-15 NOTE — ED Notes (Addendum)
Pt states that since Friday he has had back pain, generalized body aches, and nausea. Recently treated for a UTI. Tested negative for Gonnorrhea and Chlamydia. Removed a tick behind his L knee recently.  Alert and oriented.

## 2014-07-16 LAB — URINE CULTURE: Culture: NO GROWTH

## 2014-07-16 LAB — RPR: RPR Ser Ql: NONREACTIVE

## 2014-07-16 LAB — HIV ANTIBODY (ROUTINE TESTING W REFLEX): HIV Screen 4th Generation wRfx: NONREACTIVE

## 2014-07-16 MED ORDER — IOHEXOL 300 MG/ML  SOLN
100.0000 mL | Freq: Once | INTRAMUSCULAR | Status: AC | PRN
  Administered 2014-07-16: 80 mL via INTRAVENOUS

## 2014-07-16 MED ORDER — DOXYCYCLINE HYCLATE 100 MG PO CAPS: 100.0000 mg | ORAL_CAPSULE | Freq: Two times a day (BID) | ORAL | Status: DC

## 2014-07-16 MED ORDER — MELOXICAM 7.5 MG PO TABS: 7.5000 mg | ORAL_TABLET | Freq: Every day | ORAL | Status: DC

## 2014-07-16 MED ORDER — KETOROLAC TROMETHAMINE 30 MG/ML IJ SOLN
30.0000 mg | Freq: Once | INTRAMUSCULAR | Status: AC
  Administered 2014-07-16: 30 mg via INTRAVENOUS
  Filled 2014-07-16: qty 1

## 2014-07-16 MED ORDER — IOHEXOL 300 MG/ML  SOLN
50.0000 mL | Freq: Once | INTRAMUSCULAR | Status: AC | PRN
  Administered 2014-07-16: 50 mL via ORAL

## 2014-07-26 ENCOUNTER — Emergency Department (HOSPITAL_COMMUNITY)
Admission: EM | Admit: 2014-07-26 | Discharge: 2014-07-27 | Disposition: A | Discharge status: Home / Self Care | Payer: Managed Care, Other (non HMO) | Attending: Emergency Medicine | Admitting: Emergency Medicine

## 2014-07-26 ENCOUNTER — Encounter (HOSPITAL_COMMUNITY): Payer: Self-pay | Admitting: Emergency Medicine

## 2014-07-26 ENCOUNTER — Emergency Department (HOSPITAL_COMMUNITY): Payer: Managed Care, Other (non HMO)

## 2014-07-26 DIAGNOSIS — R51 Headache: Secondary | ICD-10-CM | POA: Diagnosis not present

## 2014-07-26 DIAGNOSIS — R519 Headache, unspecified: Secondary | ICD-10-CM

## 2014-07-26 DIAGNOSIS — Z791 Long term (current) use of non-steroidal anti-inflammatories (NSAID): Secondary | ICD-10-CM | POA: Diagnosis not present

## 2014-07-26 DIAGNOSIS — R1031 Right lower quadrant pain: Secondary | ICD-10-CM | POA: Diagnosis present

## 2014-07-26 DIAGNOSIS — K529 Noninfective gastroenteritis and colitis, unspecified: Secondary | ICD-10-CM | POA: Insufficient documentation

## 2014-07-26 DIAGNOSIS — R000 Tachycardia, unspecified: Secondary | ICD-10-CM | POA: Diagnosis not present

## 2014-07-26 DIAGNOSIS — R10813 Right lower quadrant abdominal tenderness: Secondary | ICD-10-CM

## 2014-07-26 DIAGNOSIS — Z720 Tobacco use: Secondary | ICD-10-CM | POA: Insufficient documentation

## 2014-07-26 DIAGNOSIS — R112 Nausea with vomiting, unspecified: Secondary | ICD-10-CM

## 2014-07-26 DIAGNOSIS — R197 Diarrhea, unspecified: Secondary | ICD-10-CM

## 2014-07-26 LAB — CBC WITH DIFFERENTIAL/PLATELET
BASOS ABS: 0 10*3/uL (ref 0.0–0.1)
Basophils Relative: 0 % (ref 0–1)
EOS ABS: 0.1 10*3/uL (ref 0.0–0.7)
Eosinophils Relative: 1 % (ref 0–5)
HEMATOCRIT: 47.9 % (ref 39.0–52.0)
Hemoglobin: 16.5 g/dL (ref 13.0–17.0)
Lymphocytes Relative: 5 % — ABNORMAL LOW (ref 12–46)
Lymphs Abs: 0.6 10*3/uL — ABNORMAL LOW (ref 0.7–4.0)
MCH: 31.5 pg (ref 26.0–34.0)
MCHC: 34.4 g/dL (ref 30.0–36.0)
MCV: 91.4 fL (ref 78.0–100.0)
Monocytes Absolute: 0.7 10*3/uL (ref 0.1–1.0)
Monocytes Relative: 5 % (ref 3–12)
NEUTROS ABS: 11.4 10*3/uL — AB (ref 1.7–7.7)
NEUTROS PCT: 89 % — AB (ref 43–77)
Platelets: 137 10*3/uL — ABNORMAL LOW (ref 150–400)
RBC: 5.24 MIL/uL (ref 4.22–5.81)
RDW: 11.6 % (ref 11.5–15.5)
WBC: 12.8 10*3/uL — ABNORMAL HIGH (ref 4.0–10.5)

## 2014-07-26 LAB — COMPREHENSIVE METABOLIC PANEL
ALT: 31 U/L (ref 17–63)
AST: 30 U/L (ref 15–41)
Albumin: 4.9 g/dL (ref 3.5–5.0)
Alkaline Phosphatase: 61 U/L (ref 38–126)
Anion gap: 11 (ref 5–15)
BUN: 13 mg/dL (ref 6–20)
CALCIUM: 9.2 mg/dL (ref 8.9–10.3)
CHLORIDE: 104 mmol/L (ref 101–111)
CO2: 25 mmol/L (ref 22–32)
Creatinine, Ser: 1.14 mg/dL (ref 0.61–1.24)
GFR calc non Af Amer: >60 mL/min (ref >60)
Glucose, Bld: 105 mg/dL — ABNORMAL HIGH (ref 65–99)
Potassium: 4 mmol/L (ref 3.5–5.1)
Sodium: 140 mmol/L (ref 135–145)
Total Bilirubin: 0.8 mg/dL (ref 0.3–1.2)
Total Protein: 8.4 g/dL — ABNORMAL HIGH (ref 6.5–8.1)

## 2014-07-26 LAB — LIPASE, BLOOD: LIPASE: 19 U/L — AB (ref 22–51)

## 2014-07-26 MED ORDER — IOHEXOL 300 MG/ML  SOLN: 100.0000 mL | Freq: Once | INTRAMUSCULAR | Status: AC | PRN

## 2014-07-26 MED ORDER — MORPHINE SULFATE 4 MG/ML IJ SOLN
4.0000 mg | Freq: Once | INTRAMUSCULAR | Status: AC
  Administered 2014-07-26: 4 mg via INTRAVENOUS
  Filled 2014-07-26: qty 1

## 2014-07-26 MED ORDER — SODIUM CHLORIDE 0.9 % IV BOLUS (SEPSIS)
1000.0000 mL | Freq: Once | INTRAVENOUS | Status: AC
  Administered 2014-07-26: 1000 mL via INTRAVENOUS

## 2014-07-26 MED ORDER — IOHEXOL 300 MG/ML  SOLN
50.0000 mL | Freq: Once | INTRAMUSCULAR | Status: AC | PRN
  Administered 2014-07-26: 50 mL via INTRAVENOUS

## 2014-07-26 MED ORDER — ONDANSETRON HCL 4 MG/2ML IJ SOLN
4.0000 mg | Freq: Once | INTRAMUSCULAR | Status: AC
  Administered 2014-07-26: 4 mg via INTRAVENOUS
  Filled 2014-07-26: qty 2

## 2014-07-26 NOTE — ED Notes (Signed)
Pt from home c/o generalized abdominal pain, nausea, and vomiting. He also c/o "migraine". He reports being bit by a tick over a week ago. He reports that he finished his antibiotics.

## 2014-07-26 NOTE — ED Provider Notes (Signed)
CSN: [id number]     Arrival date & time 07/26/14  2029 History   First MD Initiated Contact with Patient 07/26/14 2144     Chief Complaint  Patient presents with  . Abdominal Pain     (Consider location/radiation/quality/duration/timing/severity/associated sxs/prior Treatment) HPI Comments: Andrew Hampton is a 24 y.o. male who presents to the ED with complaints of generalized intermittent abdominal pain that began gradually earlier this morning and has been ongoing all day. He reports the pain is 8/10 generalized although most focal in the right lower quadrant, intermittent, aching and cramping, nonradiating, worse with movement, and with no treatments tried prior to arrival. Associated symptoms include nausea, 3 episodes of nonbloody nonbilious emesis, and 2 episodes of watery diarrhea today. He also states he has a mild headache which is less intense than his typical migraines, gradual onset since his symptoms started. He denies any fevers, chills, chest pain, shortness of breath, hematemesis, melena, hematochezia, constipation, obstipation, rectal pain, testicular pain or swelling, penile discharge, numbness, tingling, weakness, lightheadedness, dizziness, or vision changes. Denies any recent travel, sick contacts, suspicious food intake, NSAID use, or alcohol use. He reports that he finished doxycycline for a tick bite several days ago.  Patient is a 24 y.o. male presenting with abdominal pain. The history is provided by the patient. No language interpreter was used.  Abdominal Pain Pain location:  Generalized Pain quality: aching and cramping   Pain radiates to:  Does not radiate Pain severity:  Moderate Onset quality:  Gradual Duration:  12 hours Timing:  Intermittent Progression:  Unchanged Chronicity:  New Context: not alcohol use, not recent travel, not sick contacts and not suspicious food intake   Relieved by:  None tried Worsened by:  Movement Ineffective treatments:  None  tried Associated symptoms: diarrhea, nausea and vomiting   Associated symptoms: no chest pain, no chills, no constipation, no dysuria, no fever, no flatus, no hematemesis, no hematochezia, no hematuria, no melena and no shortness of breath   Risk factors: no alcohol abuse, has not had multiple surgeries and no NSAID use     History reviewed. No pertinent past medical history. Past Surgical History  Procedure Laterality Date  . Wisdom tooth extraction    . Shoulder surgery      librium   No family history on file. History  Substance Use Topics  . Smoking status: Current Every Day Smoker    Types: Cigarettes  . Smokeless tobacco: Not on file  . Alcohol Use: No    Review of Systems  Constitutional: Negative for fever and chills.  Eyes: Negative for visual disturbance.  Respiratory: Negative for shortness of breath.   Cardiovascular: Negative for chest pain.  Gastrointestinal: Positive for nausea, vomiting, abdominal pain and diarrhea. Negative for constipation, blood in stool, melena, hematochezia, rectal pain, flatus and hematemesis.  Genitourinary: Negative for dysuria, hematuria, flank pain, discharge, scrotal swelling and testicular pain.  Musculoskeletal: Negative for myalgias and arthralgias.  Skin: Negative for rash.  Allergic/Immunologic: Negative for immunocompromised state.  Neurological: Positive for headaches (very mild). Negative for dizziness, weakness, light-headedness and numbness.  Psychiatric/Behavioral: Negative for confusion.   10 Systems reviewed and are negative for acute change except as noted in the HPI.    Allergies  Bee venom and Shellfish allergy  Home Medications   Prior to Admission medications   Medication Sig Start Date End Date Taking? Authorizing Provider  doxycycline (VIBRAMYCIN) 100 MG capsule Take 1 capsule (100 mg total) by mouth 2 (two) times  daily. One po bid x 7 days 07/16/14   April Palumbo, MD  HYDROcodone-acetaminophen  (NORCO/VICODIN) 5-325 MG per tablet Take 1-2 tablets by mouth every 4 (four) hours as needed for moderate pain or severe pain. Patient not taking: Reported on 07/15/2014 02/04/14   Dahlia Client Muthersbaugh, PA-C  meloxicam (MOBIC) 7.5 MG tablet Take 1 tablet (7.5 mg total) by mouth daily. 07/16/14   April Palumbo, MD  methocarbamol (ROBAXIN) 500 MG tablet Take 1 tablet (500 mg total) by mouth 2 (two) times daily. Patient not taking: Reported on 02/04/2014 12/12/13   April Palumbo, MD  naproxen (NAPROSYN) 375 MG tablet Take 1 tablet (375 mg total) by mouth 2 (two) times daily. Patient not taking: Reported on 02/04/2014 12/12/13   April Palumbo, MD  ondansetron [facility] ODT) 8 MG disintegrating tablet 8mg  ODT q4 hours prn nausea Patient not taking: Reported on 07/15/2014 02/04/14   Dahlia Client Muthersbaugh, PA-C  promethazine (PHENERGAN) 12.5 MG tablet Take 1 tablet (12.5 mg total) by mouth every 6 (six) hours as needed for nausea or vomiting. Patient not taking: Reported on 02/04/2014 03/19/13   Roxy Horseman, PA-C   BP 126/81 mmHg  Pulse 103  Temp(Src) 97.6 F (36.4 C) (Oral)  Resp 18  Ht 5\' 11"  (1.803 m)  Wt 150 lb (68.04 kg)  BMI 20.93 kg/m2  SpO2 100% Physical Exam  Constitutional: He is oriented to person, place, and time. Vital signs are normal. He appears well-developed and well-nourished.  Non-toxic appearance. No distress.  Afebrile, nontoxic, NAD  HENT:  Head: Normocephalic and atraumatic.  Mouth/Throat: Oropharynx is clear and moist. Mucous membranes are dry.  Dry mucous membranes  Eyes: Conjunctivae and EOM are normal. Pupils are equal, round, and reactive to light. Right eye exhibits no discharge. Left eye exhibits no discharge.  Neck: Normal range of motion. Neck supple.  Cardiovascular: Regular rhythm, normal heart sounds and intact distal pulses.  Tachycardia present.  Exam reveals no gallop and no friction rub.   No murmur heard. Mild tachycardia on exam, low 100s, like related to  dehydration and pain, reg rhythm nl s1/s2, no m/r/g, no pedal edema, distal pulses intact  Pulmonary/Chest: Effort normal and breath sounds normal. No respiratory distress. He has no decreased breath sounds. He has no wheezes. He has no rhonchi. He has no rales.  Abdominal: Soft. Normal appearance and bowel sounds are normal. He exhibits no distension. There is tenderness in the right lower quadrant. There is rebound and tenderness at McBurney's point. There is no rigidity, no guarding, no CVA tenderness and negative Murphy's sign.    Soft, nondistended, +BS throughout, with some generalized lower abd tenderness most focally in the RLQ, mild rebound tenderness, no guarding or rigidity, +rovsing's, neg murphy's, +mcburney's point tenderness, no CVA TTP   Musculoskeletal: Normal range of motion.  MAE x4 Strength and sensation grossly intact Distal pulses intact No pedal edema  Neurological: He is alert and oriented to person, place, and time. He has normal strength. No cranial nerve deficit or sensory deficit. GCS eye subscore is 4. GCS verbal subscore is 5. GCS motor subscore is 6.  No focal neuro deficits  Skin: Skin is warm, dry and intact. No rash noted.  Psychiatric: He has a normal mood and affect.  Nursing note and vitals reviewed.   ED Course  Procedures (including critical care time) Labs Review Labs Reviewed  CBC WITH DIFFERENTIAL/PLATELET - Abnormal; Notable for the following:    WBC 12.8 (*)    Platelets 137 (*)  Neutrophils Relative % 89 (*)    Neutro Abs 11.4 (*)    Lymphocytes Relative 5 (*)    Lymphs Abs 0.6 (*)    All other components within normal limits  COMPREHENSIVE METABOLIC PANEL - Abnormal; Notable for the following:    Glucose, Bld 105 (*)    Total Protein 8.4 (*)    All other components within normal limits  URINALYSIS, ROUTINE W REFLEX MICROSCOPIC (NOT AT [facility]) - Abnormal; Notable for the following:    Specific Gravity, Urine 1.042 (*)    Leukocytes, UA  TRACE (*)    All other components within normal limits  LIPASE, BLOOD - Abnormal; Notable for the following:    Lipase 19 (*)    All other components within normal limits  URINE CULTURE  URINE MICROSCOPIC-ADD ON  GI PATHOGEN PANEL BY PCR, STOOL  POC OCCULT BLOOD, ED  GC/CHLAMYDIA PROBE AMP ([place]) NOT AT [facility]    Imaging Review Ct Abdomen Pelvis W Contrast  07/26/2014   CLINICAL DATA:  Generalized abdominal pain, nausea/vomiting  EXAM: CT ABDOMEN AND PELVIS WITH CONTRAST  TECHNIQUE: Multidetector CT imaging of the abdomen and pelvis was performed using the standard protocol following bolus administration of intravenous contrast.  CONTRAST:  50mL OMNIPAQUE IOHEXOL 300 MG/ML  SOLN  COMPARISON:  07/15/2014  FINDINGS: Lower chest:  Lung bases are clear.  Hepatobiliary: Liver is within normal limits. No suspicious/enhancing hepatic lesions.  Gallbladder is unremarkable. No intrahepatic or extrahepatic ductal dilatation.  Pancreas: Within normal limits.  Spleen: Within normal limits.  Adrenals/Urinary Tract: Adrenal glands are within normal limits.  Kidneys are within normal limits.  No hydronephrosis.  Bladder is within normal limits.  Stomach/Bowel: Stomach is within normal limits.  No evidence of bowel obstruction.  Normal appendix.  Vascular/Lymphatic: No evidence of abdominal aortic aneurysm.  No suspicious abdominopelvic lymphadenopathy.  Reproductive: Prostate is unremarkable.  Other: No abdominopelvic ascites.  Musculoskeletal: Visualized osseous structures are within normal limits.  IMPRESSION: Negative CT abdomen/pelvis.   Electronically Signed   By: Charline Bills M.D.   On: 07/26/2014 23:58     EKG Interpretation None      MDM   Final diagnoses:  RLQ abdominal tenderness  Nausea vomiting and diarrhea  Gastroenteritis  Chronic nonintractable headache, unspecified headache type    24 y.o. male here with generalized abd pain all day now mostly in RLQ, N/V/D, and mild HA less  severe than his typical headaches and without red flag s/sx or sudden onset. Nonfocal neuro exam. Abd exam revealing RLQ tenderness, +rovsing's sign, some rebound tenderness, no rigidity and +BS throughout. Neg murphys. CBC showing leukocytosis with neutrophilic predominance. CMP WNL. Unfortunately pt had RLQ pain 11 days ago and did have a CT at that time, but given his sudden onset pain and RLQ tenderness with n/v and leukocytosis, want to r/o appendicitis vs colitis vs diverticulitis vs other etiology. Recently finished doxycycline, no profuse diarrhea but if he has stool here will collect and test for pathogens. Will give fluids, pain and nausea meds, and reassess shortly.   12:09 AM Lipase WNL, U/A pending. CT abd/pelvis unremarkable. Pt reporting improved pain and nausea, tolerating PO. Will await U/A then plan for d/c home with pain meds and nausea meds for possible gastroenteritis. Will reassess shortly.   12:21 AM U/A with trace leuks but rare bacteria, and pt has had pyuria before, will send for culture but will not treat since last time UCx was neg. Will send home with pain  meds and zofran. Discussed BRAT diet. Will have him f/up with PCP in 5-7 days. I explained the diagnosis and have given explicit precautions to return to the ER including for any other new or worsening symptoms. The patient understands and accepts the medical plan as it's been dictated and I have answered their questions. Discharge instructions concerning home care and prescriptions have been given. The patient is STABLE and is discharged to home in good condition.  BP 137/68 mmHg  Pulse 88  Temp(Src) 97.6 F (36.4 C) (Oral)  Resp 18  Ht  (1.803 m)  Wt 150 lb (68.04 kg)  BMI 20.93 kg/m2  SpO2 100%  Meds ordered this encounter  Medications  . sodium chloride 0.9 % bolus 1,000 mL    Sig:   . ondansetron (ZOFRAN) injection 4 mg    Sig:   . morphine 4 MG/ML injection 4 mg    Sig:   . iohexol (OMNIPAQUE) 300  MG/ML solution 50 mL    Sig:   . iohexol (OMNIPAQUE) 300 MG/ML solution 100 mL    Sig:   . ondansetron (ZOFRAN ODT) 8 MG disintegrating tablet    Sig: Take 1 tablet (8 mg total) by mouth every 8 (eight) hours as needed for nausea or vomiting.    Dispense:  10 tablet    Refill:  0    Order Specific Question:  Supervising Provider    Answer:  Hyacinth Meeker, BRIAN [[id number]]  . naproxen (NAPROSYN) 500 MG tablet    Sig: Take 1 tablet (500 mg total) by mouth 2 (two) times daily as needed for mild pain, moderate pain or headache (TAKE WITH MEALS.).    Dispense:  20 tablet    Refill:  0    Order Specific Question:  Supervising Provider    Answer:  MILLER, BRIAN [[id number]]  . HYDROcodone-acetaminophen (NORCO) 5-325 MG per tablet    Sig: Take 1 tablet by mouth every 6 (six) hours as needed for severe pain.    Dispense:  6 tablet    Refill:  0    Order Specific Question:  Supervising Provider    Answer:  Eber Hong [[id number]]     Earnstine Meinders Camprubi-Soms, PA-C 07/27/14 0022  Cathren Laine, MD 07/27/14 1504

## 2014-07-27 LAB — URINALYSIS, ROUTINE W REFLEX MICROSCOPIC
Bilirubin Urine: NEGATIVE
GLUCOSE, UA: NEGATIVE mg/dL
HGB URINE DIPSTICK: NEGATIVE
Ketones, ur: NEGATIVE mg/dL
NITRITE: NEGATIVE
PROTEIN: NEGATIVE mg/dL
SPECIFIC GRAVITY, URINE: 1.042 — AB (ref 1.005–1.030)
UROBILINOGEN UA: 0.2 mg/dL (ref 0.0–1.0)
pH: 5.5 (ref 5.0–8.0)

## 2014-07-27 LAB — URINE MICROSCOPIC-ADD ON

## 2014-07-27 MED ORDER — HYDROCODONE-ACETAMINOPHEN 5-325 MG PO TABS: 1.0000 | ORAL_TABLET | Freq: Four times a day (QID) | ORAL | Status: DC | PRN

## 2014-07-27 MED ORDER — ONDANSETRON 8 MG PO TBDP: 8.0000 mg | ORAL_TABLET | Freq: Three times a day (TID) | ORAL | Status: DC | PRN

## 2014-07-27 MED ORDER — NAPROXEN 500 MG PO TABS: 500.0000 mg | ORAL_TABLET | Freq: Two times a day (BID) | ORAL | Status: DC | PRN

## 2014-07-27 NOTE — Discharge Instructions (Signed)
Use zofran as prescribed, as needed for nausea. Stay well hydrated with small sips of fluids throughout the day. Follow a BRAT (banana-rice-applesauce-toast) diet as described below for the next 24-48 hours. The 'BRAT' diet is suggested, then progress to diet as tolerated as symptoms abate. Call your doctor if bloody stools, persistent diarrhea, vomiting, fever or abdominal pain. Use naprosyn and norco as directed as needed for pain but don't drive while taking norco. Follow up with your regular doctor in 5-7 days for recheck. Return to ER for changing or worsening of symptoms.  Food Choices to Help Relieve Diarrhea When you have diarrhea, the foods you eat and your eating habits are very important. Choosing the right foods and drinks can help relieve diarrhea. Also, because diarrhea can last up to 7 days, you need to replace lost fluids and electrolytes (such as sodium, potassium, and chloride) in order to help prevent dehydration.  WHAT GENERAL GUIDELINES DO I NEED TO FOLLOW?  Slowly drink 1 cup (8 oz) of fluid for each episode of diarrhea. If you are getting enough fluid, your urine will be clear or pale yellow.  Eat starchy foods. Some good choices include white rice, white toast, pasta, low-fiber cereal, baked potatoes (without the skin), saltine crackers, and bagels.  Avoid large servings of any cooked vegetables.  Limit fruit to two servings per day. A serving is  cup or 1 small piece.  Choose foods with less than 2 g of fiber per serving.  Limit fats to less than 8 tsp (38 g) per day.  Avoid fried foods.  Eat foods that have probiotics in them. Probiotics can be found in certain dairy products.  Avoid foods and beverages that may increase the speed at which food moves through the stomach and intestines (gastrointestinal tract). Things to avoid include:  High-fiber foods, such as dried fruit, raw fruits and vegetables, nuts, seeds, and whole grain foods.  Spicy foods and high-fat  foods.  Foods and beverages sweetened with high-fructose corn syrup, honey, or sugar alcohols such as xylitol, sorbitol, and mannitol. WHAT FOODS ARE RECOMMENDED? Grains White rice. White, [place], or pita breads (fresh or toasted), including plain rolls, buns, or bagels. White pasta. Saltine, soda, or graham crackers. Pretzels. Low-fiber cereal. Cooked cereals made with water (such as cornmeal, farina, or cream cereals). Plain muffins. Matzo. Melba toast. Zwieback.  Vegetables Potatoes (without the skin). Strained tomato and vegetable juices. Most well-cooked and canned vegetables without seeds. Tender lettuce. Fruits Cooked or canned applesauce, apricots, cherries, fruit cocktail, grapefruit, peaches, pears, or plums. Fresh bananas, apples without skin, cherries, grapes, cantaloupe, grapefruit, peaches, oranges, or plums.  Meat and Other Protein Products Baked or boiled chicken. Eggs. Tofu. Fish. Seafood. Smooth peanut butter. Ground or well-cooked tender beef, ham, veal, lamb, pork, or poultry.  Dairy Plain yogurt, kefir, and unsweetened liquid yogurt. Lactose-free milk, buttermilk, or soy milk. Plain hard cheese. Beverages Sport drinks. Clear broths. Diluted fruit juices (except prune). Regular, caffeine-free sodas such as ginger ale. Water. Decaffeinated teas. Oral rehydration solutions. Sugar-free beverages not sweetened with sugar alcohols. Other Bouillon, broth, or soups made from recommended foods.  The items listed above may not be a complete list of recommended foods or beverages. Contact your dietitian for more options. WHAT FOODS ARE NOT RECOMMENDED? Grains Whole grain, whole wheat, bran, or rye breads, rolls, pastas, crackers, and cereals. Wild or brown rice. Cereals that contain more than 2 g of fiber per serving. Corn tortillas or taco shells. Cooked or dry oatmeal.  Granola. Popcorn. Vegetables Raw vegetables. Cabbage, broccoli, Brussels sprouts, artichokes, baked beans, beet  greens, corn, kale, legumes, peas, sweet potatoes, and yams. Potato skins. Cooked spinach and cabbage. Fruits Dried fruit, including raisins and dates. Raw fruits. Stewed or dried prunes. Fresh apples with skin, apricots, mangoes, pears, raspberries, and strawberries.  Meat and Other Protein Products Chunky peanut butter. Nuts and seeds. Beans and lentils. Tomasa Blase.  Dairy High-fat cheeses. Milk, chocolate milk, and beverages made with milk, such as milk shakes. Cream. Ice cream. Sweets and Desserts Sweet rolls, doughnuts, and sweet breads. Pancakes and waffles. Fats and Oils Butter. Cream sauces. Margarine. Salad oils. Plain salad dressings. Olives. Avocados.  Beverages Caffeinated beverages (such as coffee, tea, soda, or energy drinks). Alcoholic beverages. Fruit juices with pulp. Prune juice. Soft drinks sweetened with high-fructose corn syrup or sugar alcohols. Other Coconut. Hot sauce. Chili powder. Mayonnaise. Gravy. Cream-based or milk-based soups.  The items listed above may not be a complete list of foods and beverages to avoid. Contact your dietitian for more information. WHAT SHOULD I DO IF I BECOME DEHYDRATED? Diarrhea can sometimes lead to dehydration. Signs of dehydration include dark urine and dry mouth and skin. If you think you are dehydrated, you should rehydrate with an oral rehydration solution. These solutions can be purchased at pharmacies, retail stores, or online.  Drink -1 cup (120-240 mL) of oral rehydration solution each time you have an episode of diarrhea. If drinking this amount makes your diarrhea worse, try drinking smaller amounts more often. For example, drink 1-3 tsp (5-15 mL) every 5-10 minutes.  A general rule for staying hydrated is to drink 1-2 L of fluid per day. Talk to your health care provider about the specific amount you should be drinking each day. Drink enough fluids to keep your urine clear or pale yellow. Document Released: 04/09/2003 Document  Revised: 01/22/2013 Document Reviewed: 12/10/2012 [facility] Patient Information 2015 [place], [place]. This information is not intended to replace advice given to you by your health care provider. Make sure you discuss any questions you have with your health care provider.   Abdominal Pain Many things can cause belly (abdominal) pain. Most times, the belly pain is not dangerous. Many cases of belly pain can be watched and treated at home. HOME CARE   Do not take medicines that help you go poop (laxatives) unless told to by your doctor.  Only take medicine as told by your doctor.  Eat or drink as told by your doctor. Your doctor will tell you if you should be on a special diet. GET HELP IF:  You do not know what is causing your belly pain.  You have belly pain while you are sick to your stomach (nauseous) or have runny poop (diarrhea).  You have pain while you pee or poop.  Your belly pain wakes you up at night.  You have belly pain that gets worse or better when you eat.  You have belly pain that gets worse when you eat fatty foods.  You have a fever. GET HELP RIGHT AWAY IF:   The pain does not go away within 2 hours.  You keep throwing up (vomiting).  The pain changes and is only in the right or left part of the belly.  You have bloody or tarry looking poop. MAKE SURE YOU:   Understand these instructions.  Will watch your condition.  Will get help right away if you are not doing well or get worse. Document Released: 07/06/2007 Document Revised:  01/22/2013 Document Reviewed: 09/26/2012 [employer] Patient Information 2015 [place], [place]. This information is not intended to replace advice given to you by your health care provider. Make sure you discuss any questions you have with your health care provider.  Diarrhea Diarrhea is frequent loose and watery bowel movements. It can cause you to feel weak and dehydrated. Dehydration can cause you to become tired and thirsty, have  a dry mouth, and have decreased urination that often is dark yellow. Diarrhea is a sign of another problem, most often an infection that will not last long. In most cases, diarrhea typically lasts 2-3 days. However, it can last longer if it is a sign of something more serious. It is important to treat your diarrhea as directed by your caregiver to lessen or prevent future episodes of diarrhea. CAUSES  Some common causes include:  Gastrointestinal infections caused by viruses, bacteria, or parasites.  Food poisoning or food allergies.  Certain medicines, such as antibiotics, chemotherapy, and laxatives.  Artificial sweeteners and fructose.  Digestive disorders. HOME CARE INSTRUCTIONS  Ensure adequate fluid intake (hydration): Have 1 cup (8 oz) of fluid for each diarrhea episode. Avoid fluids that contain simple sugars or sports drinks, fruit juices, whole milk products, and sodas. Your urine should be clear or pale yellow if you are drinking enough fluids. Hydrate with an oral rehydration solution that you can purchase at pharmacies, retail stores, and online. You can prepare an oral rehydration solution at home by mixing the following ingredients together:   - tsp table salt.   tsp baking soda.   tsp salt substitute containing potassium chloride.  1  tablespoons sugar.  1 L (34 oz) of water.  Certain foods and beverages may increase the speed at which food moves through the gastrointestinal (GI) tract. These foods and beverages should be avoided and include:  Caffeinated and alcoholic beverages.  High-fiber foods, such as raw fruits and vegetables, nuts, seeds, and whole grain breads and cereals.  Foods and beverages sweetened with sugar alcohols, such as xylitol, sorbitol, and mannitol.  Some foods may be well tolerated and may help thicken stool including:  Starchy foods, such as rice, toast, pasta, low-sugar cereal, oatmeal, grits, baked potatoes, crackers, and  bagels.  Bananas.  Applesauce.  Add probiotic-rich foods to help increase healthy bacteria in the GI tract, such as yogurt and fermented milk products.  Wash your hands well after each diarrhea episode.  Only take over-the-counter or prescription medicines as directed by your caregiver.  Take a warm bath to relieve any burning or pain from frequent diarrhea episodes. SEEK IMMEDIATE MEDICAL CARE IF:   You are unable to keep fluids down.  You have persistent vomiting.  You have blood in your stool, or your stools are black and tarry.  You do not urinate in 6-8 hours, or there is only a small amount of very dark urine.  You have abdominal pain that increases or localizes.  You have weakness, dizziness, confusion, or light-headedness.  You have a severe headache.  Your diarrhea gets worse or does not get better.  You have a fever or persistent symptoms for more than 2-3 days.  You have a fever and your symptoms suddenly get worse. MAKE SURE YOU:   Understand these instructions.  Will watch your condition.  Will get help right away if you are not doing well or get worse. Document Released: 01/07/2002 Document Revised: 06/03/2013 Document Reviewed: 09/25/2011 [facility] Patient Information 2015 [place], [place]. This information is not intended  to replace advice given to you by your health care provider. Make sure you discuss any questions you have with your health care provider.  Nausea and Vomiting Nausea means you feel sick to your stomach. Throwing up (vomiting) is a reflex where stomach contents come out of your mouth. HOME CARE   Take medicine as told by your doctor.  Do not force yourself to eat. However, you do need to drink fluids.  If you feel like eating, eat a normal diet as told by your doctor.  Eat rice, wheat, potatoes, bread, lean meats, yogurt, fruits, and vegetables.  Avoid high-fat foods.  Drink enough fluids to keep your pee (urine) clear or pale  yellow.  Ask your doctor how to replace body fluid losses (rehydrate). Signs of body fluid loss (dehydration) include:  Feeling very thirsty.  Dry lips and mouth.  Feeling dizzy.  Dark pee.  Peeing less than normal.  Feeling confused.  Fast breathing or heart rate. GET HELP RIGHT AWAY IF:   You have blood in your throw up.  You have black or bloody poop (stool).  You have a bad headache or stiff neck.  You feel confused.  You have bad belly (abdominal) pain.  You have chest pain or trouble breathing.  You do not pee at least once every 8 hours.  You have cold, clammy skin.  You keep throwing up after 24 to 48 hours.  You have a fever. MAKE SURE YOU:   Understand these instructions.  Will watch your condition.  Will get help right away if you are not doing well or get worse. Document Released: 07/06/2007 Document Revised: 04/11/2011 Document Reviewed: 06/18/2010 [facility] Patient Information 2015 [place], [place]. This information is not intended to replace advice given to you by your health care provider. Make sure you discuss any questions you have with your health care provider.  Viral Gastroenteritis Viral gastroenteritis is also known as stomach flu. This condition affects the stomach and intestinal tract. It can cause sudden diarrhea and vomiting. The illness typically lasts 3 to 8 days. Most people develop an immune response that eventually gets rid of the virus. While this natural response develops, the virus can make you quite ill. CAUSES  Many different viruses can cause gastroenteritis, such as rotavirus or noroviruses. You can catch one of these viruses by consuming contaminated food or water. You may also catch a virus by sharing utensils or other personal items with an infected person or by touching a contaminated surface. SYMPTOMS  The most common symptoms are diarrhea and vomiting. These problems can cause a severe loss of body fluids (dehydration)  and a body salt (electrolyte) imbalance. Other symptoms may include:  Fever.  Headache.  Fatigue.  Abdominal pain. DIAGNOSIS  Your caregiver can usually diagnose viral gastroenteritis based on your symptoms and a physical exam. A stool sample may also be taken to test for the presence of viruses or other infections. TREATMENT  This illness typically goes away on its own. Treatments are aimed at rehydration. The most serious cases of viral gastroenteritis involve vomiting so severely that you are not able to keep fluids down. In these cases, fluids must be given through an intravenous line (IV). HOME CARE INSTRUCTIONS   Drink enough fluids to keep your urine clear or pale yellow. Drink small amounts of fluids frequently and increase the amounts as tolerated.  Ask your caregiver for specific rehydration instructions.  Avoid:  Foods high in sugar.  Alcohol.  Carbonated drinks.  Tobacco.  Juice.  Caffeine drinks.  Extremely hot or cold fluids.  Fatty, greasy foods.  Too much intake of anything at one time.  Dairy products until 24 to 48 hours after diarrhea stops.  You may consume probiotics. Probiotics are active cultures of beneficial bacteria. They may lessen the amount and number of diarrheal stools in adults. Probiotics can be found in yogurt with active cultures and in supplements.  Wash your hands well to avoid spreading the virus.  Only take over-the-counter or prescription medicines for pain, discomfort, or fever as directed by your caregiver. Do not give aspirin to children. Antidiarrheal medicines are not recommended.  Ask your caregiver if you should continue to take your regular prescribed and over-the-counter medicines.  Keep all follow-up appointments as directed by your caregiver. SEEK IMMEDIATE MEDICAL CARE IF:   You are unable to keep fluids down.  You do not urinate at least once every 6 to 8 hours.  You develop shortness of breath.  You notice  blood in your stool or vomit. This may look like coffee grounds.  You have abdominal pain that increases or is concentrated in one small area (localized).  You have persistent vomiting or diarrhea.  You have a fever.  The patient is a child younger than 3 months, and he or she has a fever.  The patient is a child older than 3 months, and he or she has a fever and persistent symptoms.  The patient is a child older than 3 months, and he or she has a fever and symptoms suddenly get worse.  The patient is a baby, and he or she has no tears when crying. MAKE SURE YOU:   Understand these instructions.  Will watch your condition.  Will get help right away if you are not doing well or get worse. Document Released: 01/17/2005 Document Revised: 04/11/2011 Document Reviewed: 11/03/2010 [facility] Patient Information 2015 [place], [place]. This information is not intended to replace advice given to you by your health care provider. Make sure you discuss any questions you have with your health care provider.

## 2014-07-28 LAB — URINE CULTURE: Culture: NO GROWTH

## 2014-07-28 LAB — GC/CHLAMYDIA PROBE AMP (~~LOC~~) NOT AT ARMC
Chlamydia: NEGATIVE
Neisseria Gonorrhea: NEGATIVE

## 2014-10-08 ENCOUNTER — Emergency Department (HOSPITAL_COMMUNITY)
Admission: EM | Admit: 2014-10-08 | Discharge: 2014-10-08 | Disposition: A | Discharge status: Home / Self Care | Payer: Managed Care, Other (non HMO) | Attending: Emergency Medicine | Admitting: Emergency Medicine

## 2014-10-08 ENCOUNTER — Encounter (HOSPITAL_COMMUNITY): Payer: Self-pay | Admitting: Emergency Medicine

## 2014-10-08 DIAGNOSIS — Y92410 Unspecified street and highway as the place of occurrence of the external cause: Secondary | ICD-10-CM | POA: Diagnosis not present

## 2014-10-08 DIAGNOSIS — Y9389 Activity, other specified: Secondary | ICD-10-CM | POA: Diagnosis not present

## 2014-10-08 DIAGNOSIS — T148XXA Other injury of unspecified body region, initial encounter: Secondary | ICD-10-CM

## 2014-10-08 DIAGNOSIS — S0990XA Unspecified injury of head, initial encounter: Secondary | ICD-10-CM | POA: Insufficient documentation

## 2014-10-08 DIAGNOSIS — Z720 Tobacco use: Secondary | ICD-10-CM | POA: Insufficient documentation

## 2014-10-08 DIAGNOSIS — T148 Other injury of unspecified body region: Secondary | ICD-10-CM | POA: Diagnosis not present

## 2014-10-08 DIAGNOSIS — S3992XA Unspecified injury of lower back, initial encounter: Secondary | ICD-10-CM | POA: Insufficient documentation

## 2014-10-08 DIAGNOSIS — S3991XA Unspecified injury of abdomen, initial encounter: Secondary | ICD-10-CM | POA: Diagnosis present

## 2014-10-08 DIAGNOSIS — Y998 Other external cause status: Secondary | ICD-10-CM | POA: Insufficient documentation

## 2014-10-08 DIAGNOSIS — I10 Essential (primary) hypertension: Secondary | ICD-10-CM | POA: Diagnosis not present

## 2014-10-08 MED ORDER — TRAMADOL HCL 50 MG PO TABS: 50.0000 mg | ORAL_TABLET | Freq: Four times a day (QID) | ORAL | Status: DC | PRN

## 2014-10-08 MED ORDER — NAPROXEN 375 MG PO TABS: 375.0000 mg | ORAL_TABLET | Freq: Two times a day (BID) | ORAL | Status: DC

## 2014-10-08 MED ORDER — CYCLOBENZAPRINE HCL 10 MG PO TABS: 10.0000 mg | ORAL_TABLET | Freq: Two times a day (BID) | ORAL | Status: DC | PRN
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
## 2015-01-02 IMAGING — CT CT ABD-PELV W/ CM
2 of 4 series · 17 of 46 positions shown, 19 images · IV contrast (CONTRAST)
Comparison: None available for comparison at time of study
interpretation.

CLINICAL DATA: Nausea and vomiting.

EXAM:
CT ABDOMEN AND PELVIS WITH CONTRAST
TECHNIQUE: Multidetector CT imaging of the abdomen and pelvis was performed
using the standard protocol following bolus administration of
intravenous contrast.
CONTRAST:  100mL OMNIPAQUE IOHEXOL 300 MG/ML  SOLN

[Series 2: routine · axial · 0.68mm/px · z∈[+6,+466]mm · 14 of 100 slices shown, 16 images]
[im 4/100  soft-tissue]
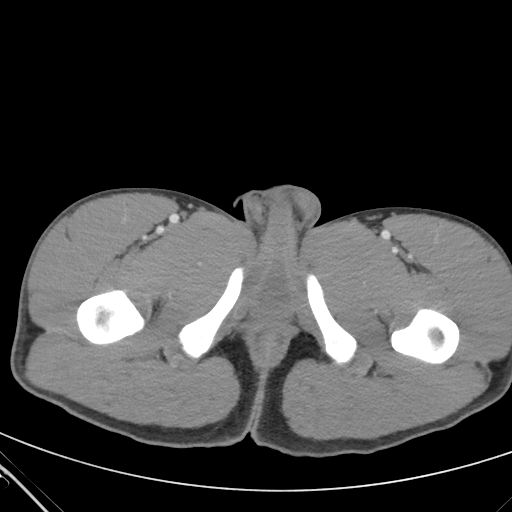
[im 4/100  bone]
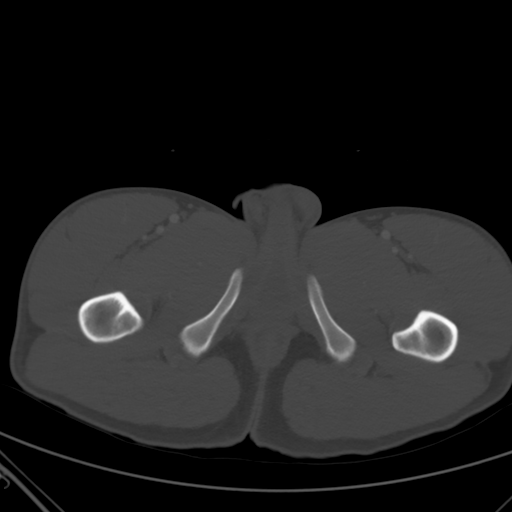
[im 12/100  soft-tissue]
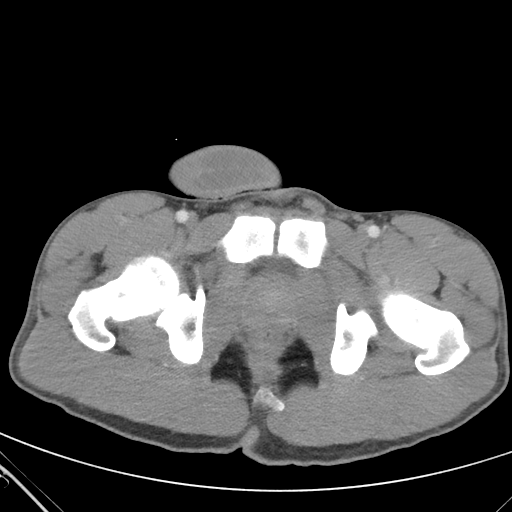
[im 20/100  soft-tissue]
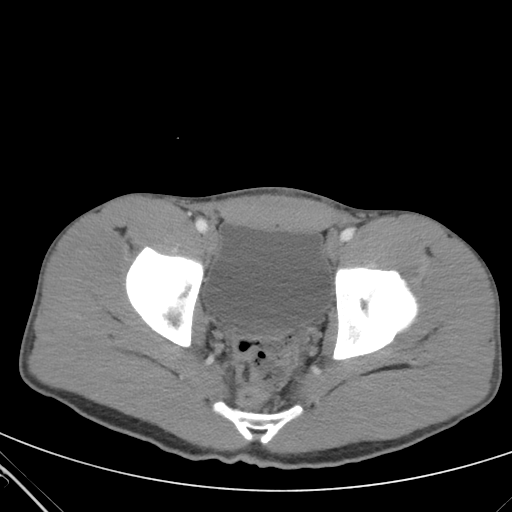
[im 28/100  soft-tissue]
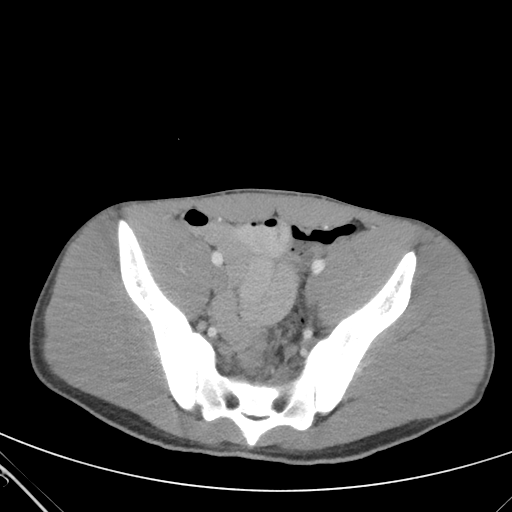
[im 32/100  soft-tissue]
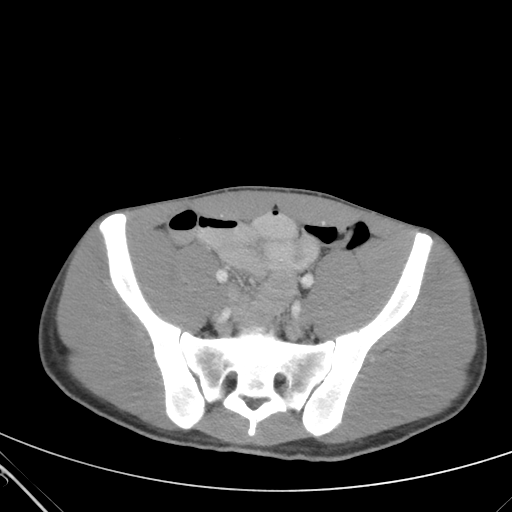
[im 40/100  soft-tissue]
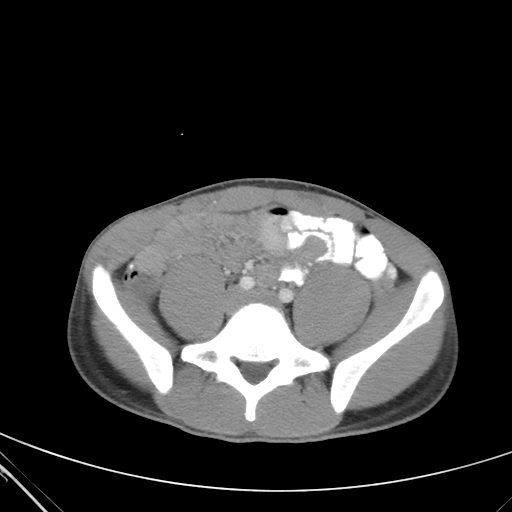
[im 48/100  soft-tissue]
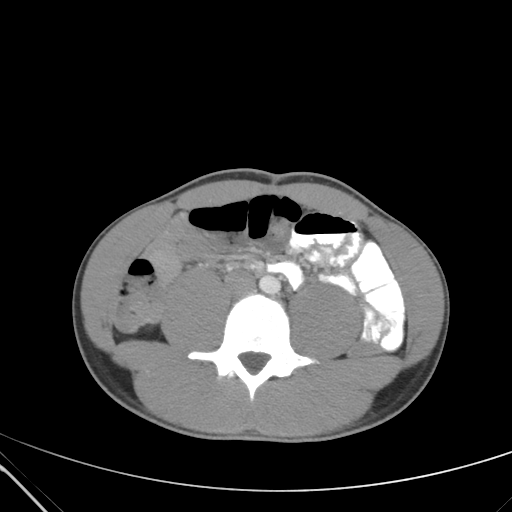
[im 52/100  soft-tissue]
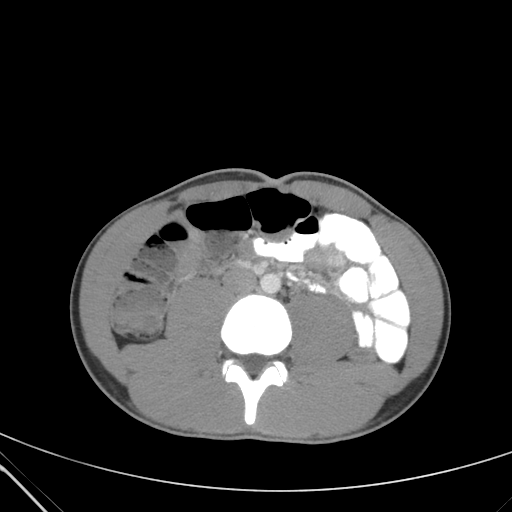
[im 60/100  soft-tissue]
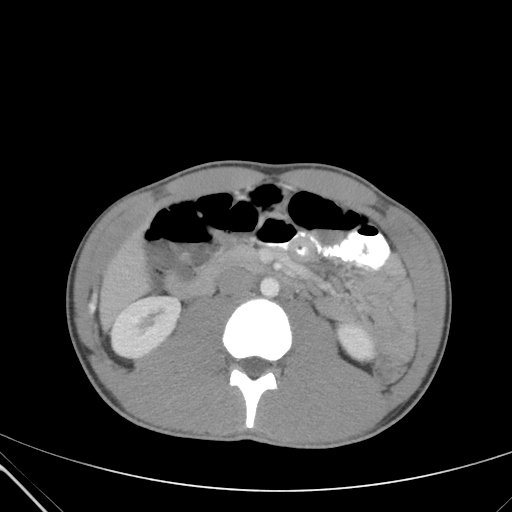
[im 60/100  bone]
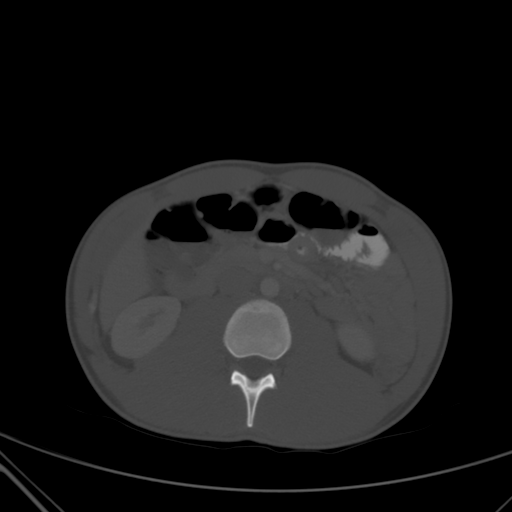
[im 68/100  soft-tissue]
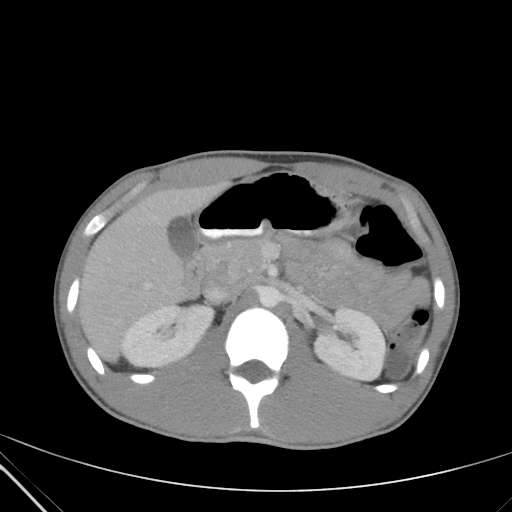
[im 76/100  soft-tissue]
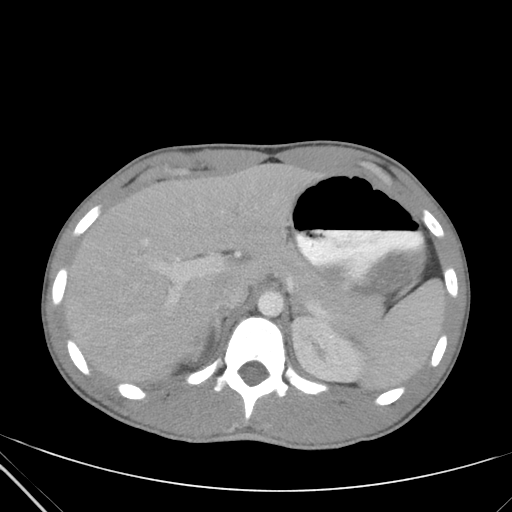
[im 80/100  soft-tissue]
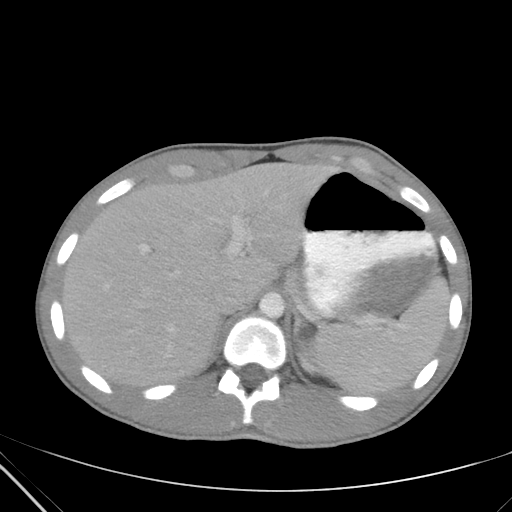
[im 88/100  soft-tissue]
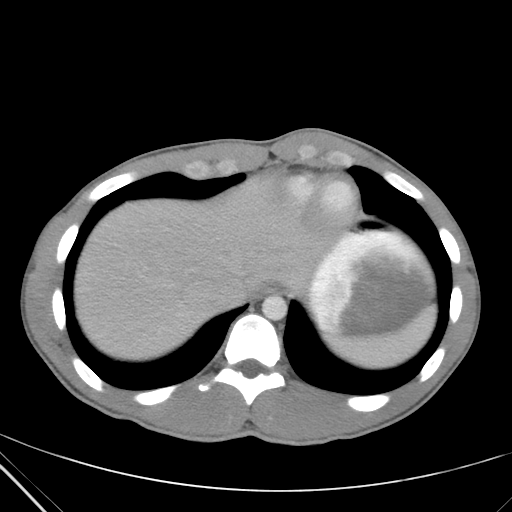
[im 96/100  soft-tissue]
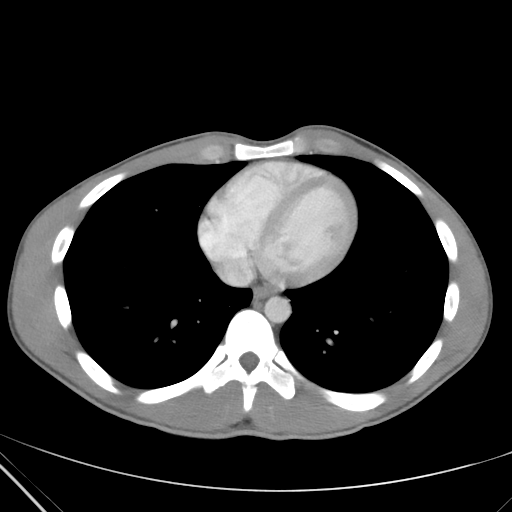

[mpr, coronals, coronal · coronal · 0.97mm/px · 3 of 91 slices shown]
[im 31/91  soft-tissue]
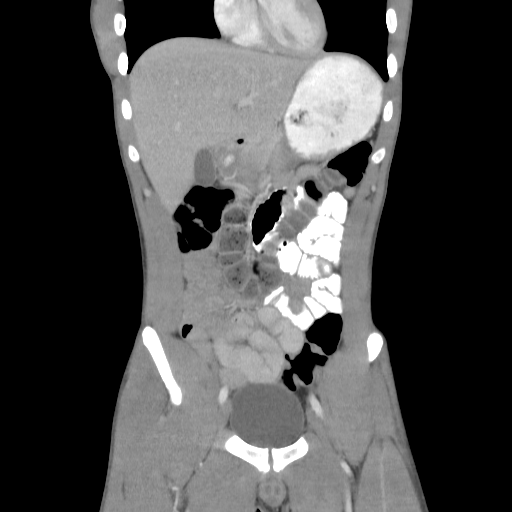
[im 41/91  soft-tissue]
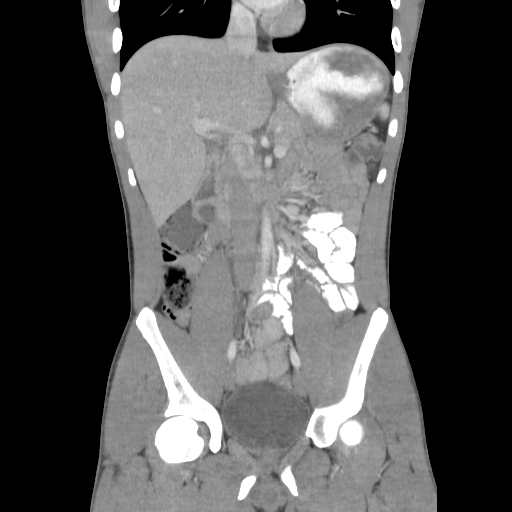
[im 51/91  soft-tissue]
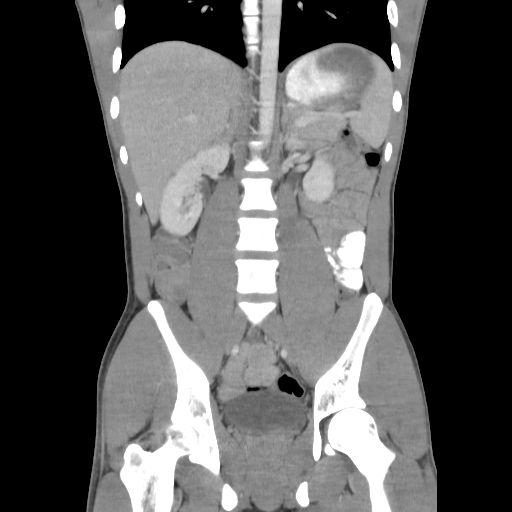

[17 of 46 positions shown; findings below may reference images not displayed]

FINDINGS: Included view of the lung bases are clear. Visualized heart and
pericardium are unremarkable.

The liver, spleen, gallbladder, pancreas and adrenal glands are
unremarkable.

The stomach, small and large bowel are normal in course and caliber
without inflammatory changes. Contrast has not yet reached the large
bowel. 3 mm appendicolith, without superimposed appendiceal
inflammatory changes or dilatation. No intraperitoneal free fluid
nor free air.

Kidneys are orthotopic, demonstrating symmetric enhancement without
nephrolithiasis, hydronephrosis or renal masses. The unopacified
ureters are normal in course and caliber. Delayed imaging through
the kidneys demonstrates symmetric prompt excretion to the proximal
urinary collecting system. Urinary bladder is partially distended
and unremarkable.

Great vessels are normal in course and caliber. No lymphadenopathy
by CT size criteria. Internal reproductive organs are unremarkable.
The soft tissues and included osseous structures are nonsuspicious.
Scattered Schmorl's nodes.
IMPRESSION: No acute intra-abdominal or pelvic process. Appendicolith without
appendicitis.

  By: Kalitin Messam

## 2015-07-28 ENCOUNTER — Other Ambulatory Visit: Payer: Self-pay | Admitting: Family Medicine

## 2015-07-28 ENCOUNTER — Ambulatory Visit
Admission: RE | Admit: 2015-07-28 | Discharge: 2015-07-28 | Disposition: A | Discharge status: Home / Self Care | Payer: Managed Care, Other (non HMO) | Source: Ambulatory Visit | Attending: Family Medicine | Admitting: Family Medicine

## 2015-07-28 DIAGNOSIS — R52 Pain, unspecified: Secondary | ICD-10-CM

## 2016-05-02 ENCOUNTER — Emergency Department (HOSPITAL_COMMUNITY): Payer: Managed Care, Other (non HMO)

## 2016-05-02 ENCOUNTER — Emergency Department (HOSPITAL_COMMUNITY)
Admission: EM | Admit: 2016-05-02 | Discharge: 2016-05-02 | Disposition: A | Discharge status: Home / Self Care | Payer: Managed Care, Other (non HMO) | Attending: Emergency Medicine | Admitting: Emergency Medicine

## 2016-05-02 DIAGNOSIS — R109 Unspecified abdominal pain: Secondary | ICD-10-CM | POA: Diagnosis not present

## 2016-05-02 DIAGNOSIS — K529 Noninfective gastroenteritis and colitis, unspecified: Secondary | ICD-10-CM | POA: Insufficient documentation

## 2016-05-02 DIAGNOSIS — F17210 Nicotine dependence, cigarettes, uncomplicated: Secondary | ICD-10-CM | POA: Insufficient documentation

## 2016-05-02 DIAGNOSIS — Z79899 Other long term (current) drug therapy: Secondary | ICD-10-CM | POA: Insufficient documentation

## 2016-05-02 DIAGNOSIS — R1110 Vomiting, unspecified: Secondary | ICD-10-CM | POA: Diagnosis present

## 2016-05-02 LAB — COMPREHENSIVE METABOLIC PANEL
ALT: 26 U/L (ref 17–63)
AST: 29 U/L (ref 15–41)
Albumin: 4.8 g/dL (ref 3.5–5.0)
Alkaline Phosphatase: 56 U/L (ref 38–126)
Anion gap: 8 (ref 5–15)
BUN: 11 mg/dL (ref 6–20)
CHLORIDE: 104 mmol/L (ref 101–111)
CO2: 25 mmol/L (ref 22–32)
CREATININE: 1.09 mg/dL (ref 0.61–1.24)
Calcium: 9.1 mg/dL (ref 8.9–10.3)
GFR calc Af Amer: >60 mL/min (ref >60)
GFR calc non Af Amer: >60 mL/min (ref >60)
GLUCOSE: 110 mg/dL — AB (ref 65–99)
Potassium: 4.1 mmol/L (ref 3.5–5.1)
Sodium: 137 mmol/L (ref 135–145)
Total Bilirubin: 1.1 mg/dL (ref 0.3–1.2)
Total Protein: 8.3 g/dL — ABNORMAL HIGH (ref 6.5–8.1)

## 2016-05-02 LAB — LIPASE, BLOOD: Lipase: 20 U/L (ref 11–51)

## 2016-05-02 LAB — CBC
HCT: 47.2 % (ref 39.0–52.0)
Hemoglobin: 16.5 g/dL (ref 13.0–17.0)
MCH: 31.7 pg (ref 26.0–34.0)
MCHC: 35 g/dL (ref 30.0–36.0)
MCV: 90.6 fL (ref 78.0–100.0)
Platelets: 157 10*3/uL (ref 150–400)
RBC: 5.21 MIL/uL (ref 4.22–5.81)
RDW: 12 % (ref 11.5–15.5)
WBC: 12.4 10*3/uL — ABNORMAL HIGH (ref 4.0–10.5)

## 2016-05-02 MED ORDER — ONDANSETRON 4 MG PO TBDP: ORAL_TABLET | ORAL | 0 refills | Status: AC

## 2016-05-02 MED ORDER — HYDROMORPHONE HCL 1 MG/ML IJ SOLN
0.5000 mg | Freq: Once | INTRAMUSCULAR | Status: AC
  Administered 2016-05-02: 0.5 mg via INTRAVENOUS
  Filled 2016-05-02: qty 0.5

## 2016-05-02 MED ORDER — IOPAMIDOL (ISOVUE-300) INJECTION 61%
INTRAVENOUS | Status: AC
  Filled 2016-05-02: qty 100

## 2016-05-02 MED ORDER — ONDANSETRON HCL 4 MG/2ML IJ SOLN
4.0000 mg | Freq: Once | INTRAMUSCULAR | Status: AC
  Administered 2016-05-02: 4 mg via INTRAVENOUS
  Filled 2016-05-02: qty 2

## 2016-05-02 MED ORDER — HYDROCODONE-ACETAMINOPHEN 5-325 MG PO TABS: 1.0000 | ORAL_TABLET | Freq: Four times a day (QID) | ORAL | 0 refills | Status: AC | PRN

## 2016-05-02 MED ORDER — HYDROMORPHONE HCL 1 MG/ML IJ SOLN
1.0000 mg | Freq: Once | INTRAMUSCULAR | Status: AC
  Administered 2016-05-02: 1 mg via INTRAVENOUS
  Filled 2016-05-02: qty 1

## 2016-05-02 MED ORDER — SODIUM CHLORIDE 0.9 % IV BOLUS (SEPSIS)
1000.0000 mL | Freq: Once | INTRAVENOUS | Status: AC
  Administered 2016-05-02: 1000 mL via INTRAVENOUS

## 2016-05-02 NOTE — ED Notes (Signed)
Bed: WA07 Expected date:  Expected time:  Means of arrival:  Comments: 

## 2016-05-02 NOTE — Discharge Instructions (Signed)
Drink plenty of fluids follow-up next week if not improving. Take Tylenol for any fevers and aches

## 2016-05-02 NOTE — ED Provider Notes (Signed)
[facility]-EMERGENCY DEPT Provider Note   CSN: [id number] Arrival date & time: 05/02/16  3086     History   Chief Complaint Chief Complaint  Patient presents with  . Abdominal Pain    HPI [name] is a 26 y.o. male.  Patient complains of vomiting and diarrhea with severe abdominal cramps. Patient has had no blood in her vomitus or diarrhea   The history is provided by the patient. No language interpreter was used.  Emesis   This is a new problem. The current episode started 12 to 24 hours ago. The problem occurs 2 to 4 times per day. The problem has not changed since onset.The emesis has an appearance of stomach contents. There has been no fever. Associated symptoms include chills and diarrhea. Pertinent negatives include no abdominal pain, no cough and no headaches.    No past medical history on file.  There are no active problems to display for this patient.   Past Surgical History:  Procedure Laterality Date  . SHOULDER SURGERY     librium  . WISDOM TOOTH EXTRACTION         Home Medications    Prior to Admission medications   Medication Sig Start Date End Date Taking? Authorizing Provider  HYDROcodone-acetaminophen (NORCO/VICODIN) 5-325 MG tablet Take 1 tablet by mouth every 6 (six) hours as needed for moderate pain. 05/02/16   Bethann Berkshire, MD  ondansetron (ZOFRAN ODT) 4 MG disintegrating tablet  ODT q4 hours prn nausea/vomit 05/02/16   Bethann Berkshire, MD    Family History No family history on file.  Social History Social History  Substance Use Topics  . Smoking status: Current Every Day Smoker    Types: Cigarettes  . Smokeless tobacco: Not on file  . Alcohol use No     Allergies   Bee venom; Iodine; and Shellfish allergy   Review of Systems Review of Systems  Constitutional: Positive for chills. Negative for appetite change and fatigue.  HENT: Negative for congestion, ear discharge and sinus pressure.   Eyes: Negative for discharge.    Respiratory: Negative for cough.   Cardiovascular: Negative for chest pain.  Gastrointestinal: Positive for diarrhea and vomiting. Negative for abdominal pain.  Genitourinary: Negative for frequency and hematuria.  Musculoskeletal: Negative for back pain.  Skin: Negative for rash.  Neurological: Negative for seizures and headaches.  Psychiatric/Behavioral: Negative for hallucinations.     Physical Exam Updated Vital Signs BP (!) 145/85 (BP Location: Right Arm)   Pulse 100   Temp 98 F (36.7 C) (Oral)   Resp 16   Ht  (1.803 m)   Wt 150 lb (68 kg)   SpO2 100%   BMI 20.92 kg/m   Physical Exam  Constitutional: He is oriented to person, place, and time. He appears well-developed.  HENT:  Head: Normocephalic.  Eyes: Conjunctivae and EOM are normal. No scleral icterus.  Neck: Neck supple. No thyromegaly present.  Cardiovascular: Normal rate and regular rhythm.  Exam reveals no gallop and no friction rub.   No murmur heard. Pulmonary/Chest: No stridor. He has no wheezes. He has no rales. He exhibits no tenderness.  Abdominal: He exhibits no distension. There is tenderness. There is no rebound.  Musculoskeletal: Normal range of motion. He exhibits no edema.  Lymphadenopathy:    He has no cervical adenopathy.  Neurological: He is oriented to person, place, and time. He exhibits normal muscle tone. Coordination normal.  Skin: No rash noted. No erythema.  Psychiatric: He has a  normal mood and affect. His behavior is normal.     ED Treatments / Results  Labs (all labs ordered are listed, but only abnormal results are displayed) Labs Reviewed  COMPREHENSIVE METABOLIC PANEL - Abnormal; Notable for the following:       Result Value   Glucose, Bld 110 (*)    Total Protein 8.3 (*)    All other components within normal limits  CBC - Abnormal; Notable for the following:    WBC 12.4 (*)    All other components within normal limits  LIPASE, BLOOD    EKG  EKG  Interpretation None       Radiology Ct Abdomen Pelvis Wo Contrast  Result Date: 05/02/2016 CLINICAL DATA:  Generalized abdominal pain with nausea EXAM: CT ABDOMEN AND PELVIS WITHOUT CONTRAST TECHNIQUE: Multidetector CT imaging of the abdomen and pelvis was performed following the standard protocol without oral or intravenous contrast material administration. COMPARISON:  July 26, 2014 FINDINGS: Lower chest: The lung bases are clear. Hepatobiliary: No focal liver lesions are evident on this noncontrast enhanced study. Gallbladder wall is not appreciably thickened. There is no biliary duct dilatation. Pancreas: There is no pancreatic mass or inflammatory focus. Spleen: No splenic lesions are evident. Adrenals/Urinary Tract: Adrenals appear unremarkable bilaterally. Kidneys bilaterally show no mass or hydronephrosis on either side. There is no appreciable renal or ureteral calculus on either side. Urinary bladder is midline with wall thickness within normal limits. Stomach/Bowel: Stomach is filled with fluid and food material. There is no appreciable bowel wall or mesenteric thickening. There is no evident bowel obstruction. No free air or portal venous air. Vascular/Lymphatic: There is no abdominal aortic aneurysm. No vascular lesions are evident on this noncontrast enhanced study. There is no demonstrable adenopathy in the abdomen or pelvis. Reproductive: Prostate and seminal vesicles are normal in size and contour. No pelvic mass evident. Other: Appendix appears unremarkable. There is no abscess or ascites evident in the abdomen or pelvis. Musculoskeletal: There are no blastic or lytic bone lesions. No intramuscular or abdominal wall lesion evident. IMPRESSION: Stomach distended with fluid and food material. No bowel wall thickening or bowel obstruction. No abscess. Appendix region appears normal. No renal or ureteral calculus. No hydronephrosis. Electronically Signed   By: Bretta Bang III M.D.   On:  05/02/2016 13:03    Procedures Procedures (including critical care time)  Medications Ordered in ED Medications  iopamidol (ISOVUE-300) 61 % injection (not administered)  HYDROmorphone (DILAUDID) injection 0.5 mg (not administered)  sodium chloride 0.9 % bolus 1,000 mL (0 mLs Intravenous Stopped 05/02/16 1303)  ondansetron (ZOFRAN) injection 4 mg (4 mg Intravenous Given 05/02/16 1133)  HYDROmorphone (DILAUDID) injection 1 mg (1 mg Intravenous Given 05/02/16 1144)     Initial Impression / Assessment and Plan / ED Course  I have reviewed the triage vital signs and the nursing notes.  Pertinent labs & imaging results that were available during my care of the patient were reviewed by me and considered in my medical decision making (see chart for details).     Patient CT scan is unremarkable. Suspect gastroenteritis. Patient will be sent home with nausea and pain medicine will follow-up as needed  Final Clinical Impressions(s) / ED Diagnoses   Final diagnoses:  Gastroenteritis    New Prescriptions New Prescriptions   HYDROCODONE-ACETAMINOPHEN (NORCO/VICODIN) 5-325 MG TABLET    Take 1 tablet by mouth every 6 (six) hours as needed for moderate pain.   ONDANSETRON (ZOFRAN ODT) 4 MG DISINTEGRATING TABLET   ODT q4 hours prn nausea/vomit     Bethann Berkshire, MD 05/02/16 1358

## 2016-05-02 NOTE — ED Triage Notes (Signed)
Pt c/o generalized abdominal pain, nausea, emesis x 2, diarrhea onset today at 0200. No sick contacts or unusual food.

## 2016-12-03 DIAGNOSIS — M542 Cervicalgia: Secondary | ICD-10-CM | POA: Diagnosis present

## 2016-12-03 DIAGNOSIS — Z79899 Other long term (current) drug therapy: Secondary | ICD-10-CM | POA: Diagnosis not present

## 2016-12-03 DIAGNOSIS — B349 Viral infection, unspecified: Secondary | ICD-10-CM | POA: Diagnosis not present

## 2016-12-04 ENCOUNTER — Emergency Department (HOSPITAL_COMMUNITY)
Admission: EM | Admit: 2016-12-04 | Discharge: 2016-12-04 | Disposition: A | Discharge status: Home / Self Care | Payer: Managed Care, Other (non HMO) | Attending: Emergency Medicine | Admitting: Emergency Medicine

## 2016-12-04 ENCOUNTER — Encounter (HOSPITAL_COMMUNITY): Payer: Self-pay | Admitting: Emergency Medicine

## 2016-12-04 DIAGNOSIS — B349 Viral infection, unspecified: Secondary | ICD-10-CM

## 2016-12-04 LAB — CBC WITH DIFFERENTIAL/PLATELET
BASOS ABS: 0 10*3/uL (ref 0.0–0.1)
BASOS PCT: 0 %
EOS ABS: 0.3 10*3/uL (ref 0.0–0.7)
Eosinophils Relative: 3 %
HEMATOCRIT: 41.9 % (ref 39.0–52.0)
HEMOGLOBIN: 15.1 g/dL (ref 13.0–17.0)
Lymphocytes Relative: 34 %
Lymphs Abs: 2.9 10*3/uL (ref 0.7–4.0)
MCH: 32.6 pg (ref 26.0–34.0)
MCHC: 36 g/dL (ref 30.0–36.0)
MCV: 90.5 fL (ref 78.0–100.0)
MONOS PCT: 7 %
Monocytes Absolute: 0.6 10*3/uL (ref 0.1–1.0)
NEUTROS ABS: 4.8 10*3/uL (ref 1.7–7.7)
NEUTROS PCT: 56 %
Platelets: 154 10*3/uL (ref 150–400)
RBC: 4.63 MIL/uL (ref 4.22–5.81)
RDW: 11.8 % (ref 11.5–15.5)
WBC: 8.7 10*3/uL (ref 4.0–10.5)

## 2016-12-04 LAB — COMPREHENSIVE METABOLIC PANEL
ALBUMIN: 4.6 g/dL (ref 3.5–5.0)
ALK PHOS: 58 U/L (ref 38–126)
ALT: 16 U/L — AB (ref 17–63)
AST: 22 U/L (ref 15–41)
Anion gap: 9 (ref 5–15)
BILIRUBIN TOTAL: 0.5 mg/dL (ref 0.3–1.2)
BUN: 14 mg/dL (ref 6–20)
CALCIUM: 9.2 mg/dL (ref 8.9–10.3)
CO2: 28 mmol/L (ref 22–32)
Chloride: 107 mmol/L (ref 101–111)
Creatinine, Ser: 1.02 mg/dL (ref 0.61–1.24)
GFR calc Af Amer: >60 mL/min (ref >60)
GFR calc non Af Amer: >60 mL/min (ref >60)
GLUCOSE: 97 mg/dL (ref 65–99)
Potassium: 3.7 mmol/L (ref 3.5–5.1)
SODIUM: 144 mmol/L (ref 135–145)
TOTAL PROTEIN: 7.8 g/dL (ref 6.5–8.1)

## 2016-12-04 LAB — URINALYSIS, ROUTINE W REFLEX MICROSCOPIC
BILIRUBIN URINE: NEGATIVE
GLUCOSE, UA: NEGATIVE mg/dL
Hgb urine dipstick: NEGATIVE
KETONES UR: NEGATIVE mg/dL
Leukocytes, UA: NEGATIVE
Nitrite: NEGATIVE
PH: 6 (ref 5.0–8.0)
Protein, ur: NEGATIVE mg/dL
SPECIFIC GRAVITY, URINE: 1.023 (ref 1.005–1.030)

## 2016-12-04 LAB — LIPASE, BLOOD: Lipase: 28 U/L (ref 11–51)

## 2016-12-04 MED ORDER — SODIUM CHLORIDE 0.9 % IV BOLUS (SEPSIS)
1000.0000 mL | Freq: Once | INTRAVENOUS | Status: AC
  Administered 2016-12-04: 1000 mL via INTRAVENOUS

## 2016-12-04 MED ORDER — TETRACAINE HCL 0.5 % OP SOLN
OPHTHALMIC | Status: AC
  Filled 2016-12-04: qty 4

## 2016-12-04 MED ORDER — KETOROLAC TROMETHAMINE 30 MG/ML IJ SOLN
30.0000 mg | Freq: Once | INTRAMUSCULAR | Status: AC
  Administered 2016-12-04: 30 mg via INTRAVENOUS
  Filled 2016-12-04: qty 1

## 2016-12-04 MED ORDER — ONDANSETRON HCL 4 MG/2ML IJ SOLN
4.0000 mg | Freq: Once | INTRAMUSCULAR | Status: AC
  Administered 2016-12-04: 4 mg via INTRAVENOUS
  Filled 2016-12-04: qty 2

## 2016-12-04 NOTE — ED Triage Notes (Signed)
PT wants to be tested for Lyme Disease due to being bit by a tick over a year ago. Pt states he has had increasing fatigue and pain in neck and back. Pt reports changing diet and has continued to have no improvement in symptoms.

## 2016-12-04 NOTE — ED Provider Notes (Signed)
[facility]-EMERGENCY DEPT Provider Note   CSN: [id number] Arrival date & time: 12/03/16  2244     History   Chief Complaint Chief Complaint  Patient presents with  . Generalized Body Aches    HPI Andrew Hampton is a 26 y.o. male otherwise healthy here with chills, neck pain, concerned for possible chronic lyme. He found a tick on him about a year ago that was removed. No abx were given. He has been having increased fatigue for the last several weeks and also neck pain for several weeks. Denies neck stiffness or rash or fevers. He states that he is unable to eat red meat due to this. He is requesting lyme titers after researching on the internet   The history is provided by the patient.    History reviewed. No pertinent past medical history.  There are no active problems to display for this patient.   Past Surgical History:  Procedure Laterality Date  . SHOULDER SURGERY     librium  . WISDOM TOOTH EXTRACTION         Home Medications    Prior to Admission medications   Medication Sig Start Date End Date Taking? Authorizing Provider  ibuprofen (ADVIL,MOTRIN) 200 MG tablet Take 400 mg by mouth every 6 (six) hours as needed for moderate pain.   Yes [provider]  HYDROcodone-acetaminophen (NORCO/VICODIN) 5-325 MG tablet Take 1 tablet by mouth every 6 (six) hours as needed for moderate pain. Patient not taking: Reported on 12/04/2016 05/02/16   Bethann Berkshire, MD  ondansetron (ZOFRAN ODT) 4 MG disintegrating tablet 4mg  ODT q4 hours prn nausea/vomit Patient not taking: Reported on 12/04/2016 05/02/16   Bethann Berkshire, MD    Family History History reviewed. No pertinent family history.  Social History Social History   Tobacco Use  . Smoking status: Never Smoker  . Smokeless tobacco: Never Used  Substance Use Topics  . Alcohol use: No  . Drug use: Yes    Types: Marijuana     Allergies   Bee venom; Iodine; and Shellfish  allergy   Review of Systems Review of Systems  Constitutional: Positive for fatigue.  All other systems reviewed and are negative.    Physical Exam Updated Vital Signs BP (!) 156/95 (BP Location: Right Arm)   Pulse 70   Temp 98 F (36.7 C) (Oral)   Resp 18   Ht 5\' 11"  (1.803 m)   Wt 68 kg (150 lb)   SpO2 100%   BMI 20.92 kg/m   Physical Exam  Constitutional: He is oriented to person, place, and time. He appears well-developed and well-nourished.  HENT:  Head: Normocephalic.  Right Ear: External ear normal.  Left Ear: External ear normal.  MM slightly dry   Eyes: Pupils are equal, round, and reactive to light.  Neck: Normal range of motion. Neck supple.  No meningeal signs, no cervical LAD   Cardiovascular: Normal rate and regular rhythm.  Pulmonary/Chest: Effort normal and breath sounds normal. No respiratory distress. He has no wheezes.  Abdominal: Soft. Bowel sounds are normal. He exhibits no distension. There is no tenderness.  Musculoskeletal: Normal range of motion.  Neurological: He is alert and oriented to person, place, and time.  Skin: Skin is warm.  Previous tick bite marks on L knee scabbed over, no obvious cellulitis   Psychiatric: He has a normal mood and affect.  Nursing note and vitals reviewed.    ED Treatments / Results  Labs (all labs ordered  are listed, but only abnormal results are displayed) Labs Reviewed  COMPREHENSIVE METABOLIC PANEL - Abnormal; Notable for the following components:      Result Value   ALT 16 (*)    All other components within normal limits  CBC WITH DIFFERENTIAL/PLATELET  LIPASE, BLOOD  URINALYSIS, ROUTINE W REFLEX MICROSCOPIC  LYME DISEASE DNA BY PCR(BORRELIA BURG)  B. BURGDORFI ANTIBODIES  ROCKY MTN SPOTTED FVR ABS PNL(IGG+IGM)    EKG  EKG Interpretation None       Radiology No results found.  Procedures Procedures (including critical care time)  Medications Ordered in ED Medications  tetracaine  (PONTOCAINE) 0.5 % ophthalmic solution (not administered)  sodium chloride 0.9 % bolus 1,000 mL (1,000 mLs Intravenous New Bag/Given 12/04/16 0100)  ondansetron (ZOFRAN) injection 4 mg (4 mg Intravenous Given 12/04/16 0100)  ketorolac (TORADOL) 30 MG/ML injection 30 mg (30 mg Intravenous Given 12/04/16 0100)     Initial Impression / Assessment and Plan / ED Course  I have reviewed the triage vital signs and the nursing notes.  Pertinent labs & imaging results that were available during my care of the patient were reviewed by me and considered in my medical decision making (see chart for details).     [name] is a 26 y.o. male here with chills, subjective fever. Has some neck pain for weeks but no meningeal signs and has nl neuro exam. He is concerned for possible chronic lyme. He has no rash. I think likely viral syndrome, no signs of meningitis. Will send labs, lyme and RMSF titer upon patient request.   2 am Patient's CBC, CMP, UA unremarkable. Stable for discharge. He can call in several days for lyme and RMSF results.    Final Clinical Impressions(s) / ED Diagnoses   Final diagnoses:  Viral syndrome    New Prescriptions This SmartLink is deprecated. Use AVSMEDLIST instead to display the medication list for a patient.   Charlynne PanderYao, David Hsienta, MD 12/04/16 [phone]

## 2016-12-05 LAB — B. BURGDORFI ANTIBODIES: B burgdorferi Ab IgG+IgM: <0.91 {ISR} (ref 0.00–0.90)

## 2016-12-06 LAB — ROCKY MTN SPOTTED FVR ABS PNL(IGG+IGM)
RMSF IGG: NEGATIVE
RMSF IgM: 0.26 index (ref 0.00–0.89)
